# Patient Record
Sex: Male | Born: 1980
Health system: Southern US, Community
[De-identification: ages and names within clinical notes are randomized; demographics above are authoritative.]

## PROBLEM LIST (undated history)

## (undated) DIAGNOSIS — Z765 Malingerer [conscious simulation]: Secondary | ICD-10-CM

## (undated) DIAGNOSIS — F1410 Cocaine abuse, uncomplicated: Secondary | ICD-10-CM

## (undated) DIAGNOSIS — F339 Major depressive disorder, recurrent, unspecified: Secondary | ICD-10-CM

## (undated) DIAGNOSIS — G8929 Other chronic pain: Secondary | ICD-10-CM

## (undated) HISTORY — PX: OTHER SURGICAL HISTORY: SHX169

---

## 2009-09-25 ENCOUNTER — Emergency Department (HOSPITAL_BASED_OUTPATIENT_CLINIC_OR_DEPARTMENT_OTHER): Admission: EM | Admit: 2009-09-25 | Discharge: 2009-09-25 | Payer: Self-pay | Admitting: Emergency Medicine

## 2009-09-25 ENCOUNTER — Ambulatory Visit: Payer: Self-pay | Admitting: Radiology

## 2009-12-28 ENCOUNTER — Emergency Department (HOSPITAL_BASED_OUTPATIENT_CLINIC_OR_DEPARTMENT_OTHER)
Admission: EM | Admit: 2009-12-28 | Discharge: 2009-12-28 | Payer: Self-pay | Source: Home / Self Care | Admitting: Emergency Medicine

## 2010-02-16 ENCOUNTER — Emergency Department (HOSPITAL_BASED_OUTPATIENT_CLINIC_OR_DEPARTMENT_OTHER)
Admission: EM | Admit: 2010-02-16 | Discharge: 2010-02-16 | Disposition: A | Discharge status: Home / Self Care | Payer: Self-pay | Attending: Emergency Medicine | Admitting: Emergency Medicine

## 2010-02-16 DIAGNOSIS — F17200 Nicotine dependence, unspecified, uncomplicated: Secondary | ICD-10-CM | POA: Insufficient documentation

## 2010-02-16 DIAGNOSIS — K089 Disorder of teeth and supporting structures, unspecified: Secondary | ICD-10-CM | POA: Insufficient documentation

## 2010-02-16 DIAGNOSIS — B009 Herpesviral infection, unspecified: Secondary | ICD-10-CM | POA: Insufficient documentation

## 2010-03-19 LAB — URINALYSIS, ROUTINE W REFLEX MICROSCOPIC
Bilirubin Urine: NEGATIVE
Hgb urine dipstick: NEGATIVE
Ketones, ur: NEGATIVE mg/dL
Specific Gravity, Urine: 1.015 (ref 1.005–1.030)
Urobilinogen, UA: 0.2 mg/dL (ref 0.0–1.0)

## 2010-03-19 LAB — URINE MICROSCOPIC-ADD ON

## 2010-03-22 LAB — DIFFERENTIAL
Lymphs Abs: 2.2 10*3/uL (ref 0.7–4.0)
Monocytes Relative: 16 % — ABNORMAL HIGH (ref 3–12)

## 2010-03-22 LAB — LIPASE, BLOOD: Lipase: 142 U/L (ref 23–300)

## 2010-03-22 LAB — CBC
MCH: 30.2 pg (ref 26.0–34.0)
MCV: 88.2 fL (ref 78.0–100.0)
Platelets: 222 10*3/uL (ref 150–400)
RBC: 5.01 MIL/uL (ref 4.22–5.81)
RDW: 12.9 % (ref 11.5–15.5)

## 2010-03-22 LAB — COMPREHENSIVE METABOLIC PANEL
ALT: 37 U/L (ref 0–53)
Albumin: 4.3 g/dL (ref 3.5–5.2)
CO2: 30 mEq/L (ref 19–32)
Calcium: 9.7 mg/dL (ref 8.4–10.5)
Chloride: 104 mEq/L (ref 96–112)
GFR calc non Af Amer: >60 mL/min (ref >60)
Glucose, Bld: 123 mg/dL — ABNORMAL HIGH (ref 70–99)
Sodium: 142 mEq/L (ref 135–145)
Total Bilirubin: 1.2 mg/dL (ref 0.3–1.2)
Total Protein: 8 g/dL (ref 6.0–8.3)

## 2010-03-22 LAB — POCT CARDIAC MARKERS
CKMB, poc: 1.3 ng/mL (ref 1.0–8.0)
Myoglobin, poc: 86.1 ng/mL (ref 12–200)
Troponin i, poc: <0.05 ng/mL (ref 0.00–0.09)

## 2010-03-22 LAB — D-DIMER, QUANTITATIVE: D-Dimer, Quant: 0.24 ug/mL-FEU (ref 0.00–0.48)

## 2010-07-14 ENCOUNTER — Emergency Department (HOSPITAL_BASED_OUTPATIENT_CLINIC_OR_DEPARTMENT_OTHER)
Admission: EM | Admit: 2010-07-14 | Discharge: 2010-07-14 | Disposition: A | Discharge status: Home / Self Care | Payer: Self-pay | Attending: Emergency Medicine | Admitting: Emergency Medicine

## 2010-07-14 ENCOUNTER — Encounter: Payer: Self-pay | Admitting: Emergency Medicine

## 2010-07-14 DIAGNOSIS — L259 Unspecified contact dermatitis, unspecified cause: Secondary | ICD-10-CM

## 2010-07-14 DIAGNOSIS — N342 Other urethritis: Secondary | ICD-10-CM

## 2010-07-14 DIAGNOSIS — R300 Dysuria: Secondary | ICD-10-CM | POA: Insufficient documentation

## 2010-07-14 LAB — URINALYSIS, ROUTINE W REFLEX MICROSCOPIC
Bilirubin Urine: NEGATIVE
Glucose, UA: NEGATIVE mg/dL
Hgb urine dipstick: NEGATIVE
Ketones, ur: NEGATIVE mg/dL
Leukocytes, UA: NEGATIVE
Nitrite: NEGATIVE
Protein, ur: NEGATIVE mg/dL
Specific Gravity, Urine: 1.022 (ref 1.005–1.030)
Urobilinogen, UA: 0.2 mg/dL (ref 0.0–1.0)
pH: 7 (ref 5.0–8.0)

## 2010-07-14 MED ORDER — AZITHROMYCIN 500 MG PO TABS
1000.0000 mg | ORAL_TABLET | Freq: Once | ORAL | Status: AC
  Administered 2010-07-14: 1000 mg via ORAL

## 2010-07-14 MED ORDER — CEFPODOXIME PROXETIL 200 MG PO TABS
400.0000 mg | ORAL_TABLET | Freq: Once | ORAL | Status: AC
  Administered 2010-07-14: 400 mg via ORAL

## 2010-07-14 MED ORDER — HYDROCORTISONE 1 % EX CREA: TOPICAL_CREAM | Freq: Four times a day (QID) | CUTANEOUS | Status: DC

## 2010-07-14 MED ORDER — AZITHROMYCIN 250 MG PO TABS
ORAL_TABLET | ORAL | Status: AC
  Filled 2010-07-14: qty 4

## 2010-07-14 MED ORDER — CEFPODOXIME PROXETIL 200 MG PO TABS
ORAL_TABLET | ORAL | Status: AC
  Administered 2010-07-14: 400 mg via ORAL
  Filled 2010-07-14: qty 2

## 2010-07-14 MED ORDER — HYDROCORTISONE 1 % EX CREA: TOPICAL_CREAM | CUTANEOUS | Status: DC

## 2010-07-14 NOTE — ED Notes (Signed)
Rash to LE  X 1 1/2 weeks.  Pt relates itching and starting to spread.  Denies pain or environmental exposure.

## 2010-07-14 NOTE — Discharge Instructions (Signed)
 [employer] Patient Information 2011 [place], [place].Urethritis, Adult Urethritis is an inflammation (soreness) of the urethra (the tube exiting from the bladder). It is often caused by germs that may be spread through sexual contact. TREATMENT Urethritis will usually respond to antibiotics. These are medications that kill germs. Take all the medicine given to you. You may feel better in a couple days, but TAKE ALL MEDICINE or the infection may not be completely cured and may become more difficult to treat. Response can generally be expected in 7 to 10 days. You may require additional treatment after more testing. IT IS VERY IMPORTANT THAT YOU  Not have sex until the test results are known and treatment is completed.   Know that you may be asked to notify your sex partner when your final test results are back.   Finish all medications as prescribed.   Prevent sexually transmitted infections including AIDS. Practice safe sex. Use condoms.  SEEK MEDICAL CARE IF:  Your symptoms are not improved in 2 to 3 days.   Your symptoms are getting worse.   Your develop abdominal (belly) pain.   You develop joint pain.   You have an oral temperature above 102 F (38.9 C).  SEEK IMMEDIATE MEDICAL CARE IF:  You have an oral temperature above 102 F (38.9 C), not controlled by medicine.   You develop severe pain in the belly, back or side.   You develop repeated vomiting.  TEST RESULTS Not all test results are available during your visit. If your test results are not back during the visit, make an appointment with your caregiver to find out the results. Do not assume everything is normal if you have not heard from your caregiver or the medical facility. It is important for you to follow-up on all of your test results. Document Released: 06/19/2000 Document Re-Released: 01/15/2009 [employer] Patient Information 2011 [employer], [place] Allergic Contact Dermatitis Allergic contact dermatitis is an itchy,  red and scaly rash. It is caused by an allergic reaction to certain substances that touch your skin. The rash often times appears within one to three days after contact.   CAUSES  Poison oak.   Poison ivy.   Chemicals (deodorants, shampoos).   Jewelry.   Latex.   Neomycin in triple antibiotic cream.  SYMPTOMS  In poison ivy or oak, a very itchy rash with tiny blisters may appear. This comes 1-2 days after exposure.   The skin may also appear to be dry with flaking.   There can be marked swelling in some areas such as the eyelids, mouth or genitals.  TREATMENT It is most important to avoid all contact with the allergic substances. Additional treatment of allergic dermatitis may include:  Burrow's solution soaks to help dry the oozing sores.   Cortisone creams or ointments applied 3-4 times daily. For best results, soak rash area in cool water for 20 minutes. Then apply the medicine. Cover the area with a plastic wrap. You can store the cortisone cream in the refrigerator for a chilly effect on your rash. That may decrease itching.   Injections or oral cortisone medicines are needed in more severe cases.   Antihistamine medicine helps ease the itching.   A cool bath can also help stop itching. Try not to scratch the rash.   Medicines that kill germs (antibiotics) may be needed if there is a skin infection present.  DIAGNOSIS In cases where the cause is uncertain, patch skin tests may be performed to help confirm the identity  of the source. HOME CARE INSTRUCTIONS  Use medications as directed.   Keep the affected area away from anything that would irritate your skin.   If you have poison oak or ivy, the clothing you were wearing at the time of contact should be washed or cleaned. This will get rid of the substance (resin) that caused the allergic reaction.   If you have a pet, the resins may also be present in their fur. They also need washing with soap and water.   You  may also prevent poison oak or ivy by washing exposed skin with soap and water as soon as possible after contact.   As with poison ivy, avoid contact with these plants. Wear protective clothing and gloves when you must be in contact. A barrier cream such as Ivy Block or Shield before exposure will help an outbreak if you bathe within 8 hours of contact.  SEEK MEDICAL CARE IF:  Your condition is not better after 3 days of treatment or you seem to keep getting worse.   You see signs of infection, such as swelling, tenderness, redness or soreness (inflammation), or warmth of affected area.   You have any problems related to medication used.  Document Released: 02/01/2004 Document Re-Released: 03/22/2008 [facility] Patient Information 2011 [place], [place].

## 2010-07-14 NOTE — ED Provider Notes (Addendum)
History     Chief Complaint  Patient presents with  . Rash   HPI Comments: Pt reported staying at a motel about 2-3 days ago and noticed rash and itching afterwards.  He also did have sexual intercourse with a new partner a few weeks ago, used condom but it came off during.  Patient is a 30 y.o. male presenting with rash and dysuria. The history is provided by the patient.  Rash  This is a new problem. The current episode started 2 days ago. The problem has been gradually worsening. The problem is associated with an unknown factor. There has been no fever. The rash is present on the right lower leg (and right shoulder). The patient is experiencing no pain. Associated symptoms include itching. He has tried antibiotic cream and antihistamines for the symptoms. The treatment provided no relief.  Dysuria  This is a new problem. The current episode started more than 2 days ago. The problem occurs every urination. The problem has not changed since onset.The quality of the pain is described as burning. The pain is mild. There has been no fever. He is sexually active. There is no history of pyelonephritis. Associated symptoms include frequency and hesitancy. Pertinent negatives include no chills, no nausea, no vomiting, no discharge and no flank pain. He has tried nothing for the symptoms. His past medical history does not include kidney stones or recurrent UTIs.    History reviewed. No pertinent past medical history.  History reviewed. No pertinent past surgical history.  History reviewed. No pertinent family history.  History  Substance Use Topics  . Smoking status: Current Everyday Smoker  . Smokeless tobacco: Not on file  . Alcohol Use: No      Review of Systems  Constitutional: Negative.  Negative for chills.  Respiratory: Negative for shortness of breath.   Cardiovascular: Negative for leg swelling.  Gastrointestinal: Negative for nausea and vomiting.  Genitourinary: Positive for  dysuria, hesitancy and frequency. Negative for flank pain, discharge, penile swelling, scrotal swelling and testicular pain.  Musculoskeletal: Negative for back pain.  Skin: Positive for itching and rash.    Physical Exam  BP 120/75  Pulse 92  Temp(Src) 98.2 F (36.8 C) (Oral)  Resp 20  SpO2 98%  Physical Exam  Constitutional: He appears well-developed and well-nourished. No distress.  Eyes: Conjunctivae are normal. Pupils are equal, round, and reactive to light.  Pulmonary/Chest: Effort normal and breath sounds normal.  Genitourinary: Testes normal and penis normal. Circumcised. No penile erythema or penile tenderness. No discharge found.  Skin:       ED Course  Procedures  MDM Pt with some dysuria, no flank pain, no fever or vomiting.  Will check GC/Chl and also UA.  Also rash appears to be from some kind of bite, no severe allergic reaction noted.  Local steroid cream for this problem and reassurance provided.      UA is neg for UTI.  Pt has had chlamydia in the past, will presumptively treat and discahrge to home.  Cultures can be called in to patient later  Gavin Pound. Oletta Lamas, MD 07/14/10 1610  Gavin Pound. Maleaha Hughett, MD 08/02/10 [phone]

## 2010-07-16 LAB — URINE CULTURE
Colony Count: NO GROWTH
Culture  Setup Time: 201207080250
Culture: NO GROWTH

## 2010-07-16 LAB — GC/CHLAMYDIA PROBE AMP, GENITAL
Chlamydia, DNA Probe: NEGATIVE
GC Probe Amp, Genital: NEGATIVE

## 2010-10-07 ENCOUNTER — Encounter (HOSPITAL_BASED_OUTPATIENT_CLINIC_OR_DEPARTMENT_OTHER): Payer: Self-pay | Admitting: Emergency Medicine

## 2010-10-07 ENCOUNTER — Emergency Department (HOSPITAL_BASED_OUTPATIENT_CLINIC_OR_DEPARTMENT_OTHER)
Admission: EM | Admit: 2010-10-07 | Discharge: 2010-10-07 | Disposition: A | Discharge status: Home / Self Care | Payer: Self-pay | Attending: Emergency Medicine | Admitting: Emergency Medicine

## 2010-10-07 DIAGNOSIS — K1370 Unspecified lesions of oral mucosa: Secondary | ICD-10-CM | POA: Insufficient documentation

## 2010-10-07 DIAGNOSIS — B002 Herpesviral gingivostomatitis and pharyngotonsillitis: Secondary | ICD-10-CM | POA: Insufficient documentation

## 2010-10-07 DIAGNOSIS — F17200 Nicotine dependence, unspecified, uncomplicated: Secondary | ICD-10-CM | POA: Insufficient documentation

## 2010-10-07 LAB — URINALYSIS, ROUTINE W REFLEX MICROSCOPIC
Bilirubin Urine: NEGATIVE
Glucose, UA: NEGATIVE mg/dL
Leukocytes, UA: NEGATIVE

## 2010-10-07 MED ORDER — VALACYCLOVIR HCL 500 MG PO TABS: 500.0000 mg | ORAL_TABLET | Freq: Two times a day (BID) | ORAL | Status: AC

## 2010-10-07 NOTE — ED Provider Notes (Signed)
History     CSN: [id number] Arrival date & time: 10/07/2010 11:39 AM Pt seen at 1150am  Chief Complaint  Patient presents with  . Mouth Lesions  . Urinary Urgency    (Consider location/radiation/quality/duration/timing/severity/associated sxs/prior treatment) Patient is a 30 y.o. male presenting with mouth sores. The history is provided by the patient.  Mouth Lesions  The current episode started more than 2 weeks ago. The problem occurs frequently. The problem has been gradually worsening. The problem is mild. The symptoms are relieved by nothing. The symptoms are aggravated by nothing. Associated symptoms include mouth sores. Pertinent negatives include no fever.    Patient is here for multiple complaints He reports fever blisters to his lips for several months with worsening, no fever/chills He reports using Valtrex previously with good relief  He also reports urinary frequency but no penile discharge no dysuria no abdominal pain, no penile lesion He does reports mild back pain is improving Reports sexually active with one partner  History reviewed. No pertinent past medical history.  History reviewed. No pertinent past surgical history.  No family history on file.  History  Substance Use Topics  . Smoking status: Current Everyday Smoker  . Smokeless tobacco: Not on file  . Alcohol Use: No      Review of Systems  Constitutional: Negative for fever.  HENT: Positive for mouth sores.     Allergies  Review of patient's allergies indicates no known allergies.  Home Medications   Current Outpatient Rx  Name Route Sig Dispense Refill  . DIPHENHYDRAMINE HCL 25 MG PO TABS Oral Take 25 mg by mouth every 6 (six) hours as needed.      Marland Kitchen HYDROCORTISONE 1 % EX CREA  Apply to affected area 3-4 times daily 15 g 0  . VALACYCLOVIR HCL 500 MG PO TABS Oral Take 1 tablet (500 mg total) by mouth 2 (two) times daily. 6 tablet 0    BP 121/72  Pulse 64  Temp(Src) 97.3 F (36.3  C) (Oral)  Resp 16  SpO2 100%  Physical Exam   CONSTITUTIONAL: Well developed/well nourished HEAD AND FACE: Normocephalic/atraumatic EYES: EOMI/PERRL ENMT: Mucous membranes moist, small blisters noted to lips, no abscess or significant erythema noted NECK: supple no meningeal signs CV: S1/S2 noted, no murmurs/rubs/gallops noted LUNGS: Lungs are clear to auscultation bilaterally, no apparent distress ABDOMEN: soft, nontender, no rebound or guarding GU:no cva tenderness, no penile lesions, no penile discharge, no testicular tenderness/mass noted Chaperone present during exam NEURO: Pt is awake/alert, moves all extremitiesx4 EXTREMITIES: pulses normal, full ROM SKIN: warm, color normal   ED Course  Procedures (including critical care time)   Labs Reviewed  URINALYSIS, ROUTINE W REFLEX MICROSCOPIC  GC/CHLAMYDIA PROBE AMP, GENITAL   No results found.   1. Recurrent oral herpes simplex       MDM  All labs/vitals reviewed and considered Nursing notes reviewed and considered in documentation Previous records reviewed and considered  Pt requesting valtrex, I advised OTC meds I took a GC/chlam sample, advised pt to f/u on results        Joya Gaskins, MD 10/07/10 1238

## 2010-10-07 NOTE — ED Notes (Signed)
Pt c/o fever blisters on lips x 2 mos; also c/o "peeing just a little" & low back pain

## 2010-10-08 LAB — GC/CHLAMYDIA PROBE AMP, GENITAL
Chlamydia, DNA Probe: NEGATIVE
GC Probe Amp, Genital: NEGATIVE

## 2011-02-10 ENCOUNTER — Encounter (HOSPITAL_BASED_OUTPATIENT_CLINIC_OR_DEPARTMENT_OTHER): Payer: Self-pay | Admitting: *Deleted

## 2011-02-10 ENCOUNTER — Emergency Department (HOSPITAL_BASED_OUTPATIENT_CLINIC_OR_DEPARTMENT_OTHER)
Admission: EM | Admit: 2011-02-10 | Discharge: 2011-02-10 | Disposition: A | Discharge status: Home / Self Care | Payer: Self-pay | Attending: Emergency Medicine | Admitting: Emergency Medicine

## 2011-02-10 DIAGNOSIS — J029 Acute pharyngitis, unspecified: Secondary | ICD-10-CM | POA: Insufficient documentation

## 2011-02-10 DIAGNOSIS — F17200 Nicotine dependence, unspecified, uncomplicated: Secondary | ICD-10-CM | POA: Insufficient documentation

## 2011-02-10 LAB — MONONUCLEOSIS SCREEN: Mono Screen: NEGATIVE

## 2011-02-10 MED ORDER — DEXAMETHASONE SODIUM PHOSPHATE 10 MG/ML IJ SOLN
10.0000 mg | Freq: Once | INTRAMUSCULAR | Status: AC
Start: 2011-02-10 — End: 2011-02-10
  Administered 2011-02-10: 10 mg via INTRAMUSCULAR
  Filled 2011-02-10: qty 1

## 2011-02-10 NOTE — ED Provider Notes (Signed)
History     CSN: [id number]  Arrival date & time 02/10/11  1458   First MD Initiated Contact with Patient 02/10/11 1519      Chief Complaint  Patient presents with  . Sore Throat    (Consider location/radiation/quality/duration/timing/severity/associated sxs/prior treatment) HPI Comments: Pt states that he has had intermittent fever over the last month  Patient is a 31 y.o. male presenting with pharyngitis. The history is provided by the patient. No language interpreter was used.  Sore Throat This is a new problem. The current episode started 1 to 4 weeks ago. The problem occurs constantly. The problem has been unchanged. Associated symptoms include a fever and a sore throat. Pertinent negatives include no abdominal pain, neck pain or rash. The symptoms are aggravated by swallowing. He has tried nothing for the symptoms.    History reviewed. No pertinent past medical history.  History reviewed. No pertinent past surgical history.  History reviewed. No pertinent family history.  History  Substance Use Topics  . Smoking status: Current Everyday Smoker  . Smokeless tobacco: Not on file  . Alcohol Use: No      Review of Systems  Constitutional: Positive for fever.  HENT: Positive for sore throat. Negative for neck pain.   Gastrointestinal: Negative for abdominal pain.  Skin: Negative for rash.  All other systems reviewed and are negative.    Allergies  Review of patient's allergies indicates no known allergies.  Home Medications   Current Outpatient Rx  Name Route Sig Dispense Refill  . DIPHENHYDRAMINE HCL 25 MG PO TABS Oral Take 25 mg by mouth every 6 (six) hours as needed.      Marland Kitchen HYDROCORTISONE 1 % EX CREA  Apply to affected area 3-4 times daily 15 g 0    BP 129/74  Pulse 90  Temp(Src) 98 F (36.7 C) (Oral)  Resp 18  Ht 5\' 11"  (1.803 m)  Wt 160 lb (72.576 kg)  BMI 22.32 kg/m2  SpO2 100%  Physical Exam  Nursing note and vitals reviewed. Constitutional:  He is oriented to person, place, and time. He appears well-developed and well-nourished.  HENT:  Right Ear: External ear normal.  Left Ear: External ear normal.  Mouth/Throat: Posterior oropharyngeal edema and posterior oropharyngeal erythema present. No oropharyngeal exudate or tonsillar abscesses.  Eyes: EOM are normal.  Neck: Neck supple.  Cardiovascular: Normal rate and regular rhythm.   Pulmonary/Chest: Effort normal and breath sounds normal.  Neurological: He is alert and oriented to person, place, and time.  Skin: Skin is warm and dry.  Psychiatric: He has a normal mood and affect.    ED Course  Procedures (including critical care time)   Labs Reviewed  STREP A DNA PROBE  MONONUCLEOSIS SCREEN   No results found.   1. Pharyngitis       MDM  Pt given steroids her to help with inflammation        Teressa Lower, NP 02/10/11 [phone]

## 2011-02-10 NOTE — ED Provider Notes (Signed)
Medical screening examination/treatment/procedure(s) were performed by non-physician practitioner and as supervising physician I was immediately available for consultation/collaboration.  Evvie Behrmann, MD 02/10/11 2203 

## 2011-02-10 NOTE — ED Notes (Signed)
Pt states he has had a sore throat and trouble swallowing x 1 month. Feels like he has to clear his throat all the time. Throat is red and swollen.

## 2011-04-15 ENCOUNTER — Encounter (HOSPITAL_BASED_OUTPATIENT_CLINIC_OR_DEPARTMENT_OTHER): Payer: Self-pay | Admitting: *Deleted

## 2011-04-15 ENCOUNTER — Emergency Department (HOSPITAL_BASED_OUTPATIENT_CLINIC_OR_DEPARTMENT_OTHER)
Admission: EM | Admit: 2011-04-15 | Discharge: 2011-04-15 | Disposition: A | Discharge status: Home / Self Care | Payer: Self-pay | Attending: Emergency Medicine | Admitting: Emergency Medicine

## 2011-04-15 DIAGNOSIS — R221 Localized swelling, mass and lump, neck: Secondary | ICD-10-CM | POA: Insufficient documentation

## 2011-04-15 DIAGNOSIS — R220 Localized swelling, mass and lump, head: Secondary | ICD-10-CM | POA: Insufficient documentation

## 2011-04-15 DIAGNOSIS — B0089 Other herpesviral infection: Secondary | ICD-10-CM

## 2011-04-15 DIAGNOSIS — F17200 Nicotine dependence, unspecified, uncomplicated: Secondary | ICD-10-CM | POA: Insufficient documentation

## 2011-04-15 MED ORDER — HYDROCODONE-ACETAMINOPHEN 5-325 MG PO TABS: 1.0000 | ORAL_TABLET | Freq: Four times a day (QID) | ORAL | Status: AC | PRN

## 2011-04-15 MED ORDER — ACYCLOVIR 400 MG PO TABS: 400.0000 mg | ORAL_TABLET | Freq: Every day | ORAL | Status: AC

## 2011-04-15 NOTE — Discharge Instructions (Signed)
Herpes Simplex Virus Herpes simplex virus is a viral infection that may infect many different areas of the body, such as the genitalia and mouth. There are two different strains of the virus: herpes simplex virus 1 (HSV-1) and herpes simplex virus 2 (HSV-2). HSV-1 is typically associated with infections of the mouth and lips. HSV-2 is associated with infections of the genitals. However, either strain of the virus may infect any area. HSV may be spread through saliva particles or sexual contact. One unusual form of HSV-1, known as herpes gladiatorum, is passed from skin-to-skin contact, such as in wrestling. SYMPTOMS   Sometimes, no symptoms.   Fever.   Headache.   Muscle aches.   Tingling.   Itching.   Tenderness.   Genital burning feeling.   Genital pain.   Pain with urination.   Pain with sexual intercourse.   Small blisters in the affected areas.  RISK FACTORS   Kissing an infected person.   Sharing eating utensils with an infected person.   Unprotected sexual activity.   Multiple sexual partners.   Direct contact sports without protective clothing.   Contact with an exposed herpes sore.   Stress, illness, and cold increase the risk of recurrence.  PROGNOSIS  The primary outbreak of an HSV infection usually lasts 2 to 3 weeks. However, it has been known to last up to 6 weeks. After the primary outbreak subsides, the virus goes into a stage known as latency. During this time, there may be no physical symptoms of infection. After a period of time, some event, such as stress, cold, or illness will trigger another outbreak. This cycle of latency and outbreak may continue indefinitely. The outbreaks usually become milder over time. The body cannot rid itself of HSV. RELATED COMPLICATIONS   Recurrence.   Infection in other areas of the body, such as the eye (ocular herpetic infection, keratitis) and rarely the brain (herpetic encephalitis).  TREATMENT  Many HSV  infections can be treated without medicine. During an outbreak, avoid touching the sores. Ice may be used to dull the pain and suppress the virus. Exposure to the sun is a common trigger for an outbreak, so the use of sunscreen may help in such cases. Avoid sexual contact during outbreaks. During the latent periods, it is advised that you use latex condoms, which will reduce the likelihood of spreading the virus to another person. Condoms made from animal products do not protect against HSV. Male condoms cover a larger area than male condoms, and may offer the most protection from the transmission of HSV. The presence of HSV will not affect a condom's ability to protect against pregnancy. Only take medicines for pain and discomfort if directed to do so by your caregiver. Many claims exist that certain dietary changes will prevent an outbreak, but these claims have not been proven. These claims include eating foods that are high in L-lysine and low in arginine (i.e. yogurt, beets, apples, pears, mangoes, oily fish (such as salmon, haddock, snapper, and swordfish), soybean sprouts, chicken, and tomatoes).  Athletes may return to play once they are showing no symptoms, and they have been treated.  Document Released: 12/24/2004 Document Revised: 12/13/2010 Document Reviewed: 04/07/2008 [facility] Patient Information 2012 [place], [place].

## 2011-04-15 NOTE — ED Provider Notes (Signed)
History     CSN: [id number]  Arrival date & time 04/15/11  1731   First MD Initiated Contact with Patient 04/15/11 1813    5:57 PM HPI Omar Miller is a 31 y.o. male complaining of upper lip infection. Reports about 2 weeks ago began having a "fever blisters" on his upper lip. States since then has had progressive worsening of symptoms. Reports lip is red and painful. Denies fever, n/v, neck pain. Reports pain radiates to all his teeth. And he is having a difficult time speaking.  The history is provided by the patient.    History reviewed. No pertinent past medical history.  History reviewed. No pertinent past surgical history.  No family history on file.  History  Substance Use Topics  . Smoking status: Current Everyday Smoker  . Smokeless tobacco: Not on file  . Alcohol Use: No      Review of Systems  Constitutional: Negative for fever and chills.  HENT: Positive for facial swelling (lip swelling). Negative for nosebleeds, congestion and neck pain.   Gastrointestinal: Negative for nausea and vomiting.  Skin: Positive for color change, rash and wound.  Neurological: Negative for dizziness and headaches.  All other systems reviewed and are negative.    Allergies  Review of patient's allergies indicates no known allergies.  Home Medications  No current outpatient prescriptions on file.  BP 119/80  Pulse 100  Temp(Src) 98.8 F (37.1 C) (Oral)  Resp 20  Ht 5\' 10"  (1.778 m)  Wt 160 lb (72.576 kg)  BMI 22.96 kg/m2  SpO2 100%  Physical Exam  Constitutional: He is oriented to person, place, and time. He appears well-developed and well-nourished.  HENT:  Head: Normocephalic and atraumatic.  Mouth/Throat:    Eyes: Conjunctivae are normal. Pupils are equal, round, and reactive to light.  Neck: Normal range of motion. Neck supple.  Cardiovascular: Normal rate, regular rhythm and normal heart sounds.   Pulmonary/Chest: Effort normal and breath sounds normal.    Neurological: He is alert and oriented to person, place, and time.  Skin: Skin is warm and dry. No rash noted. No erythema. No pallor.  Psychiatric: He has a normal mood and affect. His behavior is normal.    ED Course  Procedures   MDM   Will treat with acyclovir and advised recheck in the ED in 48-72 hours. Discussed plan with Dr. Fredricka Bonine. Patient will also be given an analgesic.         Thomasene Lot, PA-C 04/15/11 1905

## 2011-04-15 NOTE — ED Notes (Signed)
Patient states he has had multiple fever blisters on his upper lip for the last 4-5 days.  Hx of same, but not as severe as today.

## 2011-05-04 NOTE — ED Provider Notes (Signed)
Evaluation and management procedures were performed by the PA/NP/resident physician under my supervision/collaboration.   Felisa Bonier, MD 05/04/11 [phone]

## 2011-10-19 IMAGING — CR DG FINGER LITTLE 2+V*L*
3 series · 3 of 3 positions shown · non-contrast
Comparison: None

CLINICAL DATA: Injury to left fifth finger

LEFT LITTLE FINGER 2+V

[x finger pa left]
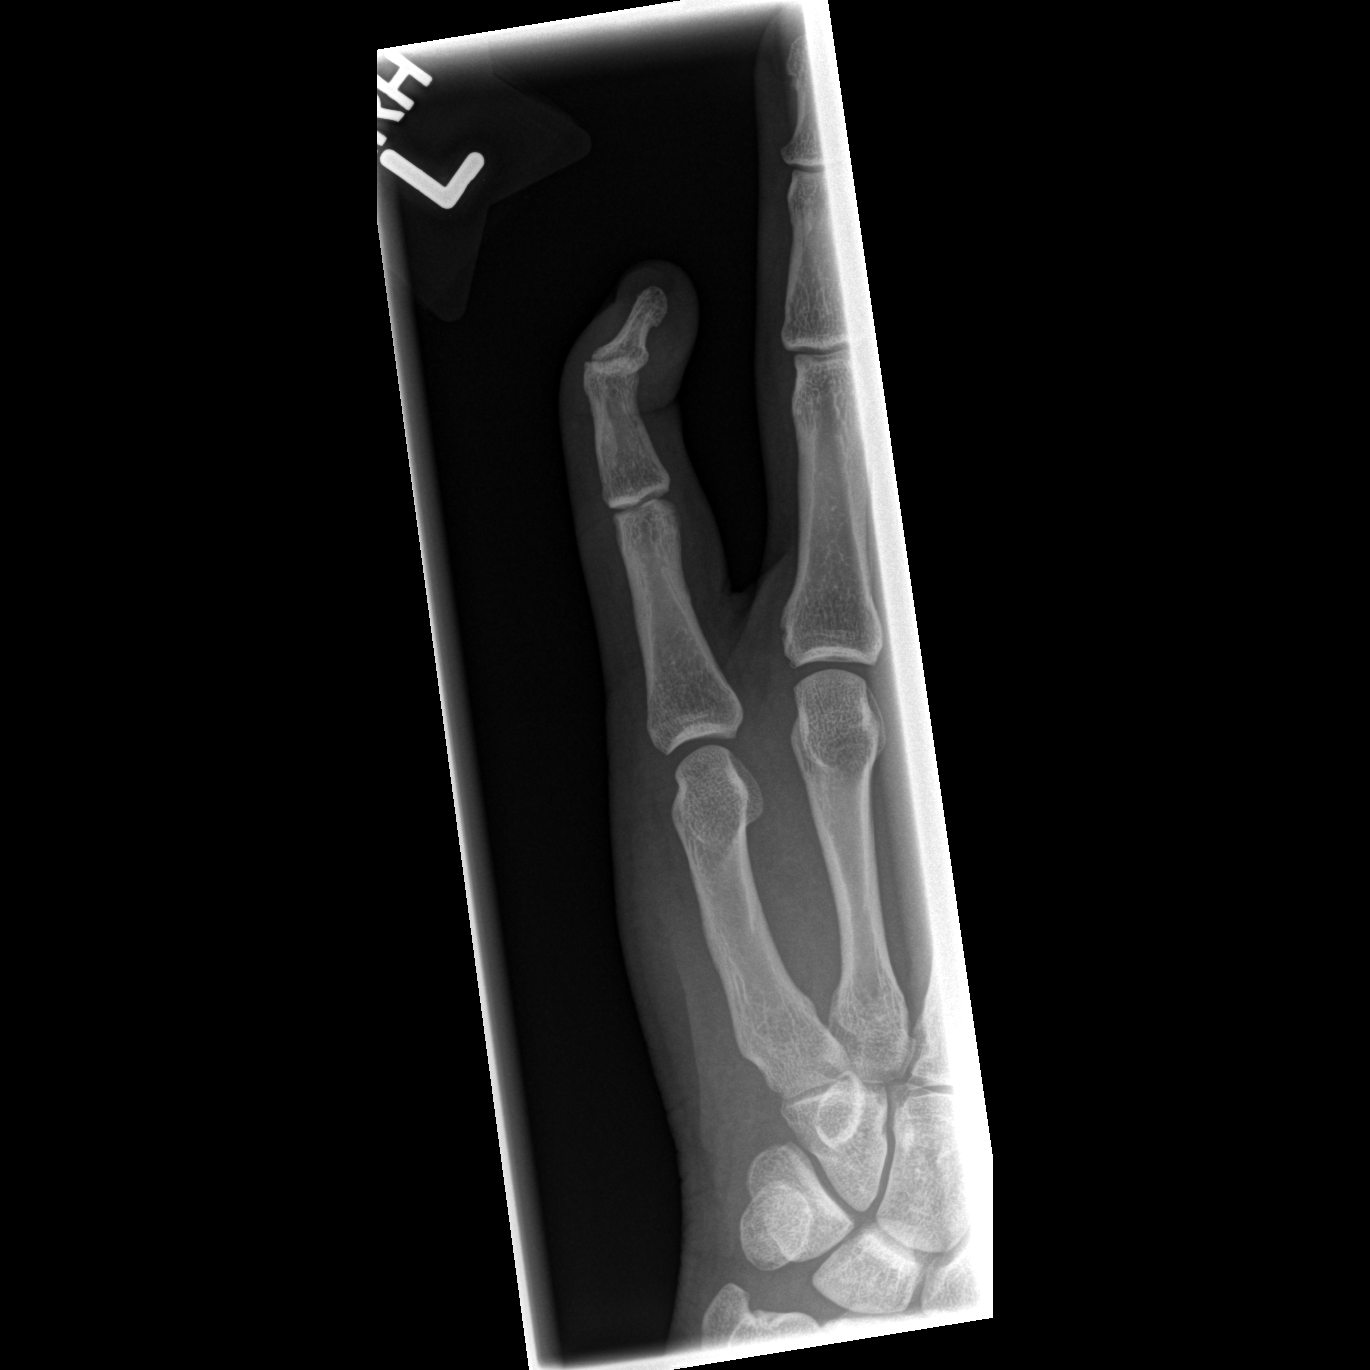

[x finger obl. left]
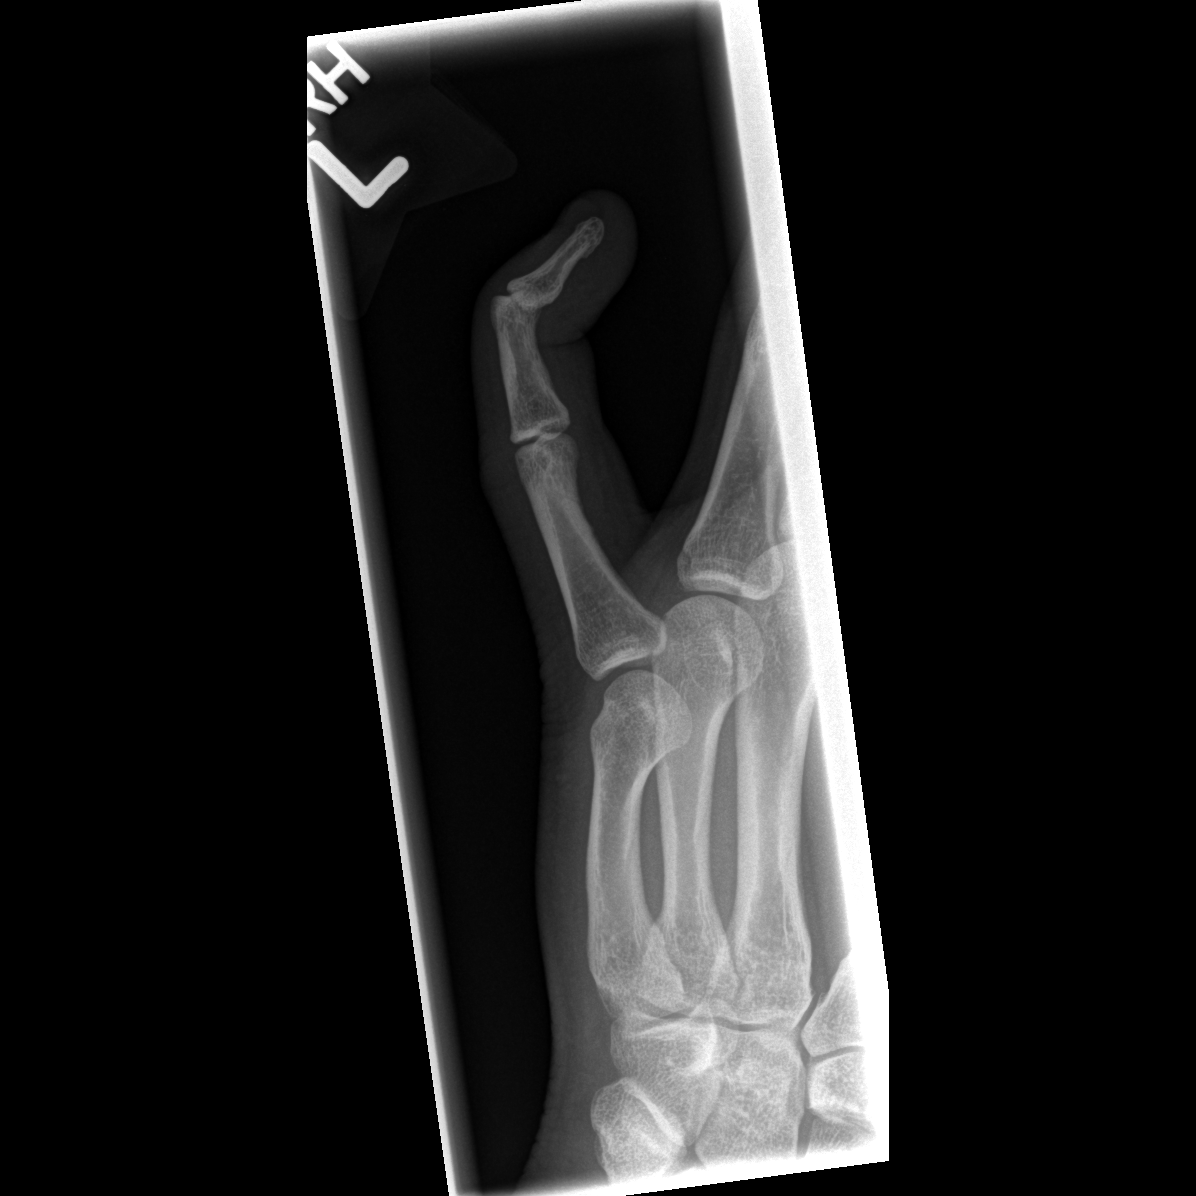

[x finger lateral left]
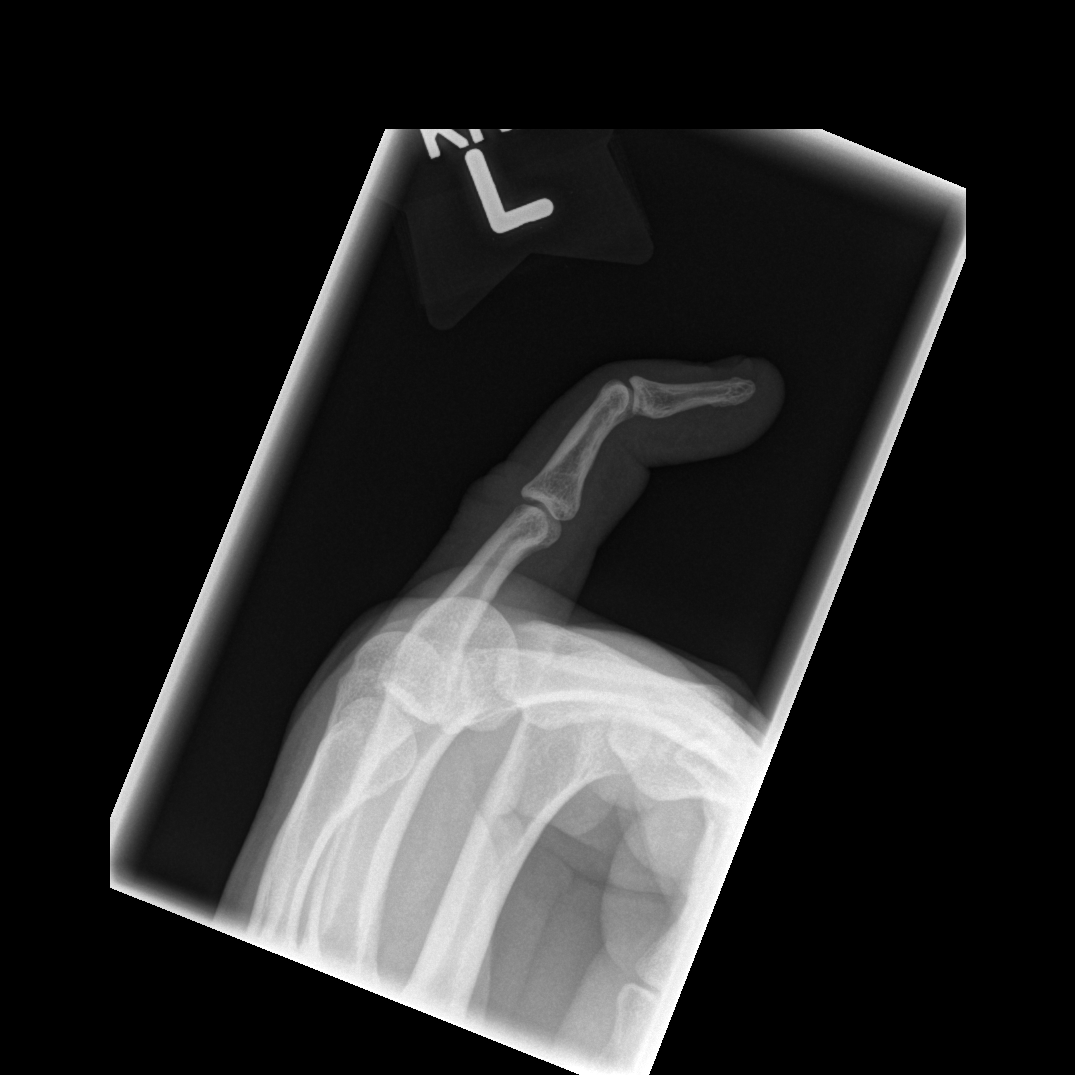

[3 of 3 positions shown; findings below may reference images not displayed]

FINDINGS: The DIP joint is in a flexed position.  No definite
fracture or dislocation.  No foreign body or gas in the soft
tissues.
IMPRESSION: Normal except that the DIP joint is in a flexed position.

## 2016-01-08 HISTORY — PX: EXTENSOR TENDON OF FOREARM / WRIST REPAIR: SHX1547

## 2016-05-27 ENCOUNTER — Encounter (HOSPITAL_BASED_OUTPATIENT_CLINIC_OR_DEPARTMENT_OTHER): Payer: Self-pay | Admitting: Emergency Medicine

## 2016-05-27 ENCOUNTER — Emergency Department (HOSPITAL_BASED_OUTPATIENT_CLINIC_OR_DEPARTMENT_OTHER)
Admission: EM | Admit: 2016-05-27 | Discharge: 2016-05-27 | Disposition: A | Discharge status: Home / Self Care | Payer: Self-pay | Attending: Physician Assistant | Admitting: Physician Assistant

## 2016-05-27 DIAGNOSIS — Z87891 Personal history of nicotine dependence: Secondary | ICD-10-CM | POA: Insufficient documentation

## 2016-05-27 DIAGNOSIS — N342 Other urethritis: Secondary | ICD-10-CM | POA: Insufficient documentation

## 2016-05-27 LAB — URINALYSIS, ROUTINE W REFLEX MICROSCOPIC
BILIRUBIN URINE: NEGATIVE
GLUCOSE, UA: NEGATIVE mg/dL
HGB URINE DIPSTICK: NEGATIVE
KETONES UR: NEGATIVE mg/dL
Leukocytes, UA: NEGATIVE
Nitrite: NEGATIVE
PROTEIN: NEGATIVE mg/dL
Specific Gravity, Urine: 1.017 (ref 1.005–1.030)
pH: 7 (ref 5.0–8.0)

## 2016-05-27 MED ORDER — METRONIDAZOLE 500 MG PO TABS
2000.0000 mg | ORAL_TABLET | Freq: Once | ORAL | Status: AC
  Administered 2016-05-27: 2000 mg via ORAL
  Filled 2016-05-27: qty 4

## 2016-05-27 MED ORDER — CEFTRIAXONE SODIUM 250 MG IJ SOLR
250.0000 mg | Freq: Once | INTRAMUSCULAR | Status: AC
  Administered 2016-05-27: 250 mg via INTRAMUSCULAR
  Filled 2016-05-27: qty 250

## 2016-05-27 MED ORDER — AZITHROMYCIN 250 MG PO TABS
1000.0000 mg | ORAL_TABLET | Freq: Once | ORAL | Status: AC
  Administered 2016-05-27: 1000 mg via ORAL
  Filled 2016-05-27: qty 4

## 2016-05-27 NOTE — Discharge Instructions (Signed)
Return here as needed.  Follow-up with a primary doctor as needed. 

## 2016-05-27 NOTE — ED Triage Notes (Signed)
Patient states that he started to have burning with urination yesterday and has a discharge.

## 2016-05-27 NOTE — ED Notes (Signed)
[facility] called for transportation

## 2016-05-27 NOTE — ED Provider Notes (Signed)
[facility] Provider Note   CSN: [id number] Arrival date & time: 05/27/16  1431  By signing my name below, I, Rosana Fret, attest that this documentation has been prepared under the direction and in the presence of non-physician practitioner, Kesleigh Morson, Cristal Deer, PA-C.  Electronically Signed: Rosana Fret, ED Scribe. 05/27/16. 5:32 PM.   History   Chief Complaint Chief Complaint  Patient presents with  . Dysuria   The history is provided by the patient. No language interpreter was used.   HPI Comments: Omar Miller is a 36 y.o. male who presents to the Emergency Department complaining of moderate dysuria onset several days ago. Pt describes pain as a burning sensation when he urinates. Pt reports associated clear/white discharge from his penis. Per pt, he has had unprotected sexual contact in the last week. No hx of similar symptoms. No treatment tried prior to arrival in the ED. Pt denies fever or any other complaints at this time.  History reviewed. No pertinent past medical history.  There are no active problems to display for this patient.   History reviewed. No pertinent surgical history.     Home Medications    Prior to Admission medications   Not on File    Family History History reviewed. No pertinent family history.  Social History Social History  Substance Use Topics  . Smoking status: Former Smoker    Quit date: 11/23/2015  . Smokeless tobacco: Never Used  . Alcohol use No     Allergies   Patient has no known allergies.   Review of Systems Review of Systems All other systems negative except as documented in the HPI. All pertinent positives and negatives as reviewed in the HPI.  Physical Exam Updated Vital Signs BP 129/81 (BP Location: Right Arm)   Pulse 86   Temp 98.3 F (36.8 C) (Oral)   Resp 20   Ht 5\' 11"  (1.803 m)   Wt 183 lb (83 kg)   SpO2 100%   BMI 25.52 kg/m   Physical Exam  Constitutional: He is oriented  to person, place, and time. He appears well-developed and well-nourished. No distress.  HENT:  Head: Normocephalic and atraumatic.  Eyes: Pupils are equal, round, and reactive to light.  Cardiovascular: Normal rate.   Pulmonary/Chest: Effort normal.  Genitourinary: Penis normal.  Genitourinary Comments: No rash to the scrotum. No discharge noted.  Neurological: He is alert and oriented to person, place, and time.  Skin: Skin is warm and dry.  Psychiatric: He has a normal mood and affect.  Nursing note and vitals reviewed.    ED Treatments / Results  DIAGNOSTIC STUDIES: Oxygen Saturation is 100% on RA, normal by my interpretation.   COORDINATION OF CARE: 4:25 PM-Discussed next steps with pt including STD testing. Pt verbalized understanding and is agreeable with the plan.   Labs (all labs ordered are listed, but only abnormal results are displayed) Labs Reviewed  URINALYSIS, ROUTINE W REFLEX MICROSCOPIC    EKG  EKG Interpretation None       Radiology No results found.  Procedures Procedures (including critical care time)  Medications Ordered in ED Medications - No data to display   Initial Impression / Assessment and Plan / ED Course  I have reviewed the triage vital signs and the nursing notes.  Pertinent labs & imaging results that were available during my care of the patient were reviewed by me and considered in my medical decision making (see chart for details).     Patient be  treated for STDs, based on the fact that he has discharge and dysuria.  The patient is advised to return here as needed.  Patient agrees the plan and all questions were answered  Final Clinical Impressions(s) / ED Diagnoses   Final diagnoses:  None    New Prescriptions New Prescriptions   No medications on file   I personally performed the services described in this documentation, which was scribed in my presence. The recorded information has been reviewed and is  accurate.     Charlestine NightLawyer, Ernest Orr, PA-C 05/28/16 0115    Abelino DerrickMackuen, Courteney Lyn, MD 05/28/16 2350

## 2016-05-28 LAB — GC/CHLAMYDIA PROBE AMP (~~LOC~~) NOT AT ARMC
Chlamydia: NEGATIVE
Neisseria Gonorrhea: NEGATIVE

## 2016-06-13 ENCOUNTER — Emergency Department (HOSPITAL_BASED_OUTPATIENT_CLINIC_OR_DEPARTMENT_OTHER)
Admission: EM | Admit: 2016-06-13 | Discharge: 2016-06-13 | Disposition: A | Discharge status: Home / Self Care | Payer: Self-pay | Attending: Emergency Medicine | Admitting: Emergency Medicine

## 2016-06-13 ENCOUNTER — Encounter (HOSPITAL_BASED_OUTPATIENT_CLINIC_OR_DEPARTMENT_OTHER): Payer: Self-pay | Admitting: Emergency Medicine

## 2016-06-13 DIAGNOSIS — N342 Other urethritis: Secondary | ICD-10-CM | POA: Insufficient documentation

## 2016-06-13 DIAGNOSIS — Z87891 Personal history of nicotine dependence: Secondary | ICD-10-CM | POA: Insufficient documentation

## 2016-06-13 LAB — URINALYSIS, ROUTINE W REFLEX MICROSCOPIC
Bilirubin Urine: NEGATIVE
Glucose, UA: NEGATIVE mg/dL
Hgb urine dipstick: NEGATIVE
Ketones, ur: NEGATIVE mg/dL
LEUKOCYTES UA: NEGATIVE
NITRITE: NEGATIVE
PH: 7 (ref 5.0–8.0)
Protein, ur: NEGATIVE mg/dL
SPECIFIC GRAVITY, URINE: 1.018 (ref 1.005–1.030)

## 2016-06-13 MED ORDER — METRONIDAZOLE 500 MG PO TABS
2000.0000 mg | ORAL_TABLET | Freq: Once | ORAL | Status: AC
  Administered 2016-06-13: 2000 mg via ORAL
  Filled 2016-06-13: qty 4

## 2016-06-13 MED ORDER — DOXYCYCLINE HYCLATE 100 MG PO CAPS: 100.0000 mg | ORAL_CAPSULE | Freq: Two times a day (BID) | ORAL | 0 refills | Status: DC

## 2016-06-13 NOTE — ED Notes (Signed)
Pt called for room no response.  

## 2016-06-13 NOTE — Discharge Instructions (Signed)
Get help right away if: You have severe pain in the belly, back, or side. You have repeated vomiting.

## 2016-06-13 NOTE — ED Notes (Signed)
[facility] with [facility] to let her know pt is ready for discharge -- states someone will come to pick him up.

## 2016-06-13 NOTE — ED Triage Notes (Addendum)
[facility] is [facility]. Ongoing penile discharge and dysuria from his last visit. Pt received abx while in the department.

## 2016-06-13 NOTE — ED Provider Notes (Signed)
[facility]   CSN: [id number] Arrival date & time: 06/13/16  1318     History   Chief Complaint Chief Complaint  Patient presents with  . Penile Discharge    HPI Omar Miller is a 36 y.o. male who presents emergency Department with chief complaint of penile discharge. The patient was seen 2 weeks ago for the same and treated with IM Rocephin and azithromycin. He states that he is having a burning sensation with urination and has noticed white and clear discharge from his penis. He also states that when he is defecating. He has discharge from his penis. He denies any other pain with defecation, pain in his rectum. He is heterosexual and had an unprotected sexual intercourse about a month ago. 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  HPI  History reviewed. No pertinent past medical history.  There are no active problems to display for this patient.   History reviewed. No pertinent surgical history.     Home Medications    Prior to Admission medications   Not on File    Family History No family history on file.  Social History Social History  Substance Use Topics  . Smoking status: Former Smoker    Quit date: 11/23/2015  . Smokeless tobacco: Never Used  . Alcohol use No     Allergies   Patient has no known allergies.   Review of Systems Review of Systems Ten systems reviewed and are negative for acute change, except as noted in the HPI.    Physical Exam Updated Vital Signs BP (!) 141/82 (BP Location: Left Arm)   Pulse 97   Temp 98.4 F (36.9 C) (Oral)   Resp 16   Ht 5\' 10"  (1.778 m)   Wt 86.2 kg (190 lb)   SpO2 98%   BMI 27.26 kg/m   Physical Exam  Constitutional: He appears well-developed and well-nourished. No distress.  HENT:  Head: Normocephalic and atraumatic.  Eyes: Conjunctivae are normal. No scleral icterus.  Neck: Normal range of motion. Neck supple.  Cardiovascular: Normal rate, regular rhythm and  normal heart sounds.   Pulmonary/Chest: Effort normal and breath sounds normal. No respiratory distress.  Abdominal: Soft. There is no tenderness.  Genitourinary:  Genitourinary Comments: Normal male anatomy, circumcised, no lesions or discharge, no tenderness of the testicles or swelling. No inguinal lymphadenopathy  Musculoskeletal: He exhibits no edema.  Neurological: He is alert.  Skin: Skin is warm and dry. He is not diaphoretic.  Psychiatric: His behavior is normal.  Nursing note and vitals reviewed.    ED Treatments / Results  Labs (all labs ordered are listed, but only abnormal results are displayed) Labs Reviewed  URINALYSIS, ROUTINE W REFLEX MICROSCOPIC    EKG  EKG Interpretation None       Radiology No results found.  Procedures Procedures (including critical care time)  Medications Ordered in ED Medications - No data to display   Initial Impression / Assessment and Plan / ED Course  I have reviewed the triage vital signs and the nursing notes.  Pertinent labs & imaging results that were available during my care of the patient were reviewed by me and considered in my medical decision making (see chart for details).     Patient without physical evidence of urethral radius, however, continues to have dysuria.  For trichomoniasis with 2 g of by mouth Flagyl. Patient will be treated for potential prostatitis given the discharge with defecation, although he has no  pain at all. I have given signed out to PA Laveda Norman, who will assume care. Pending urinalysis. Patient should be discharged with 21 day course of doxycycline and [facility] follow-up. Otherwise, treat for infected urine with Cipro or Keflex.  Final diagnoses:  None    New Prescriptions New Prescriptions   No medications on file     Arthor Captain, PA-C 06/13/16 1620    Vanetta Mulders, MD 06/15/16 1049

## 2016-06-13 NOTE — ED Notes (Signed)
ED Provider at bedside. 

## 2019-02-18 ENCOUNTER — Encounter (HOSPITAL_BASED_OUTPATIENT_CLINIC_OR_DEPARTMENT_OTHER): Payer: Self-pay | Admitting: Emergency Medicine

## 2019-02-18 ENCOUNTER — Emergency Department (HOSPITAL_BASED_OUTPATIENT_CLINIC_OR_DEPARTMENT_OTHER)
Admission: EM | Admit: 2019-02-18 | Discharge: 2019-02-18 | Disposition: A | Discharge status: Home / Self Care | Payer: HRSA Program | Attending: Emergency Medicine | Admitting: Emergency Medicine

## 2019-02-18 ENCOUNTER — Other Ambulatory Visit: Payer: Self-pay

## 2019-02-18 DIAGNOSIS — B349 Viral infection, unspecified: Secondary | ICD-10-CM | POA: Diagnosis not present

## 2019-02-18 DIAGNOSIS — U071 COVID-19: Secondary | ICD-10-CM | POA: Diagnosis not present

## 2019-02-18 DIAGNOSIS — Z87891 Personal history of nicotine dependence: Secondary | ICD-10-CM | POA: Diagnosis not present

## 2019-02-18 DIAGNOSIS — R0981 Nasal congestion: Secondary | ICD-10-CM | POA: Diagnosis present

## 2019-02-18 LAB — SARS CORONAVIRUS 2 (TAT 6-24 HRS): SARS Coronavirus 2: POSITIVE — AB

## 2019-02-18 NOTE — ED Provider Notes (Signed)
[facility] Emergency Department Provider Note MRN:  [id number]  Arrival date & time: 02/18/19     Chief Complaint   COVID symptoms   History of Present Illness   Omar Miller is a 39 y.o. year-old male with no pertinent past medical history presenting to the ED with chief complaint of chills.  2 days of nasal congestion, body aches, headaches, general malaise, chills.  Mild cough.  No sore throat.  No chest pain or shortness of breath, no abdominal pain.  2 or 3 episodes of diarrhea yesterday.  Stayed the weekend with a friend who had a close contact Covid exposure at work.  Review of Systems  A complete 10 system review of systems was obtained and all systems are negative except as noted in the HPI and PMH.   Patient's Health History   History reviewed. No pertinent past medical history.  History reviewed. No pertinent surgical history.  No family history on file.  Social History   Socioeconomic History  . Marital status: Single    Spouse name: Not on file  . Number of children: Not on file  . Years of education: Not on file  . Highest education level: Not on file  Occupational History  . Not on file  Tobacco Use  . Smoking status: Former Smoker    Quit date: 11/23/2015    Years since quitting: 3.2  . Smokeless tobacco: Never Used  Substance and Sexual Activity  . Alcohol use: No  . Drug use: Yes    Types: Marijuana    Comment: occ  . Sexual activity: Not on file  Other Topics Concern  . Not on file  Social History Narrative  . Not on file   Social Determinants of Health   Financial Resource Strain:   . Difficulty of Paying Living Expenses: Not on file  Food Insecurity:   . Worried About Charity fundraiser in the Last Year: Not on file  . Ran Out of Food in the Last Year: Not on file  Transportation Needs:   . Lack of Transportation (Medical): Not on file  . Lack of Transportation (Non-Medical): Not on file  Physical  Activity:   . Days of Exercise per Week: Not on file  . Minutes of Exercise per Session: Not on file  Stress:   . Feeling of Stress : Not on file  Social Connections:   . Frequency of Communication with Friends and Family: Not on file  . Frequency of Social Gatherings with Friends and Family: Not on file  . Attends Religious Services: Not on file  . Active Member of Clubs or Organizations: Not on file  . Attends Archivist Meetings: Not on file  . Marital Status: Not on file  Intimate Partner Violence:   . Fear of Current or Ex-Partner: Not on file  . Emotionally Abused: Not on file  . Physically Abused: Not on file  . Sexually Abused: Not on file     Physical Exam   Vitals:   02/18/19 0723  BP: 130/83  Pulse: 73  Resp: 18  Temp: 99.2 F (37.3 C)  SpO2: 99%    CONSTITUTIONAL: Well-appearing, NAD NEURO:  Alert and oriented x 3, no focal deficits EYES:  eyes equal and reactive ENT/NECK:  no LAD, no JVD CARDIO: Regular rate, well-perfused, normal S1 and S2 PULM:  CTAB no wheezing or rhonchi GI/GU:  normal bowel sounds, non-distended, non-tender MSK/SPINE:  No gross deformities, no edema SKIN:  no rash, atraumatic PSYCH:  Appropriate speech and behavior  *Additional and/or pertinent findings included in MDM below  Diagnostic and Interventional Summary    EKG Interpretation  Date/Time:    Ventricular Rate:    PR Interval:    QRS Duration:   QT Interval:    QTC Calculation:   R Axis:     Text Interpretation:        Cardiac Monitoring Interpretation:  Labs Reviewed  SARS CORONAVIRUS 2 (TAT 6-24 HRS)    No orders to display    Medications - No data to display   Procedures  /  Critical Care Procedures  ED Course and Medical Decision Making  I have reviewed the triage vital signs, the nursing notes, and pertinent available records from the EMR.  Pertinent labs & imaging results that were available during my care of the patient were reviewed by  me and considered in my medical decision making (see below for details).     Reassuring vital signs and general appearance, no increased work of breathing, no hypoxia, clear lungs, benign abdomen, myriad of symptoms consistent with viral illness, possibly coronavirus.  Appropriate for home quarantine, Covid swab, strict return precautions.  Omar Miller was evaluated in Emergency Department on 02/18/2019 for the symptoms described in the history of present illness. He was evaluated in the context of the global COVID-19 pandemic, which necessitated consideration that the patient might be at risk for infection with the SARS-CoV-2 virus that causes COVID-19. Institutional protocols and algorithms that pertain to the evaluation of patients at risk for COVID-19 are in a state of rapid change based on information released by regulatory bodies including the CDC and federal and state organizations. These policies and algorithms were followed during the patient's care in the ED.     Omar Miller. Omar Miller, [place] [email or web address]  Final Clinical Impressions(s) / ED Diagnoses     ICD-10-CM   1. Viral illness  B34.9     ED Discharge Orders    None       Discharge Instructions Discussed with and Provided to Patient:     Discharge Instructions     You were evaluated in the Emergency Department and after careful evaluation, we did not find any emergent condition requiring admission or further testing in the hospital.  Your exam/testing today was overall reassuring.  Your symptoms seem to be due to a viral illness, possibly the coronavirus.  We have tested you for the coronavirus here in the Emergency Department.  Please isolate or quarantine at home until you receive a negative test result.  If positive, we recommend continued home quarantine per [facility] recommendations.   Please return to the Emergency Department if you experience any worsening  of your condition.  We encourage you to follow up with a primary care provider.  Thank you for allowing [place] to be a part of your care.       Maudie Flakes, MD 02/18/19 [phone]

## 2019-02-18 NOTE — ED Triage Notes (Signed)
Cough and congestion x 2 days 

## 2019-02-18 NOTE — ED Notes (Signed)
Pt verbalized understanding of dc instructions.

## 2019-02-18 NOTE — Discharge Instructions (Signed)
You were evaluated in the Emergency Department and after careful evaluation, we did not find any emergent condition requiring admission or further testing in the hospital. ° °Your exam/testing today was overall reassuring.  Your symptoms seem to be due to a viral illness, possibly the coronavirus.  We have tested you for the coronavirus here in the Emergency Department.  Please isolate or quarantine at home until you receive a negative test result.  If positive, we recommend continued home quarantine per CDC recommendations. ° ° °Please return to the Emergency Department if you experience any worsening of your condition.  We encourage you to follow up with a primary care provider.  Thank you for allowing us to be a part of your care. ° °

## 2019-06-29 ENCOUNTER — Emergency Department (HOSPITAL_BASED_OUTPATIENT_CLINIC_OR_DEPARTMENT_OTHER)
Admission: EM | Admit: 2019-06-29 | Discharge: 2019-06-29 | Disposition: A | Discharge status: Home / Self Care | Payer: Self-pay | Attending: Emergency Medicine | Admitting: Emergency Medicine

## 2019-06-29 ENCOUNTER — Encounter (HOSPITAL_BASED_OUTPATIENT_CLINIC_OR_DEPARTMENT_OTHER): Payer: Self-pay | Admitting: *Deleted

## 2019-06-29 ENCOUNTER — Other Ambulatory Visit: Payer: Self-pay

## 2019-06-29 DIAGNOSIS — B349 Viral infection, unspecified: Secondary | ICD-10-CM | POA: Insufficient documentation

## 2019-06-29 DIAGNOSIS — F17210 Nicotine dependence, cigarettes, uncomplicated: Secondary | ICD-10-CM | POA: Insufficient documentation

## 2019-06-29 DIAGNOSIS — Y990 Civilian activity done for income or pay: Secondary | ICD-10-CM | POA: Insufficient documentation

## 2019-06-29 DIAGNOSIS — Y9289 Other specified places as the place of occurrence of the external cause: Secondary | ICD-10-CM | POA: Insufficient documentation

## 2019-06-29 DIAGNOSIS — X500XXA Overexertion from strenuous movement or load, initial encounter: Secondary | ICD-10-CM | POA: Insufficient documentation

## 2019-06-29 DIAGNOSIS — Y9389 Activity, other specified: Secondary | ICD-10-CM | POA: Insufficient documentation

## 2019-06-29 DIAGNOSIS — S39012A Strain of muscle, fascia and tendon of lower back, initial encounter: Secondary | ICD-10-CM | POA: Insufficient documentation

## 2019-06-29 DIAGNOSIS — J069 Acute upper respiratory infection, unspecified: Secondary | ICD-10-CM | POA: Insufficient documentation

## 2019-06-29 MED ORDER — KETOROLAC TROMETHAMINE 60 MG/2ML IM SOLN
30.0000 mg | Freq: Once | INTRAMUSCULAR | Status: AC
  Administered 2019-06-29: 08:00:00 30 mg via INTRAMUSCULAR
  Filled 2019-06-29: qty 2

## 2019-06-29 MED ORDER — CYCLOBENZAPRINE HCL 10 MG PO TABS: 10.0000 mg | ORAL_TABLET | Freq: Two times a day (BID) | ORAL | 0 refills | Status: DC | PRN

## 2019-06-29 MED ORDER — NAPROXEN 500 MG PO TABS: 500.0000 mg | ORAL_TABLET | Freq: Two times a day (BID) | ORAL | 0 refills | Status: DC | PRN

## 2019-06-29 NOTE — Discharge Instructions (Signed)
Recommend rest, alternate ice and warm compresses to affected area.  Recommend trial of anti-inflammatories as well as muscle relaxers.  Note the muscle relaxant can make you drowsy should not be taken with driving or operating heavy machinery.  If you develop worsening pain, numbness, weakness, fevers or other new concerning symptom, recommend return to ER for reassessment.

## 2019-06-29 NOTE — ED Triage Notes (Signed)
Knot in my back for 3-4 weeks and nasal congestion.

## 2019-06-30 NOTE — ED Provider Notes (Signed)
[facility] EMERGENCY DEPARTMENT Provider Note   CSN: [id number] Arrival date & time: 06/29/19  3154     History Chief Complaint  Patient presents with   Nasal Congestion   Back Pain    Omar Miller is a 39 y.o. male.  Presents to ER with 2 complaints.  Nasal congestion and back pain.  Has had increased congestion over the last few days, however has not had any associated cough, difficulty breathing, fever, body aches.  No alleviating or aggravating factors.  No known sick contacts.  Regarding back pain, has had low back pain worse on both sides over the past few days but also had some pain a few weeks ago.  No numbness, weakness, bladder or bowel incontinence, urinary retention, IVDU, fever.  For his back patient, frequently heavy lifting, on his feet regularly.  Requests work note.   HPI     History reviewed. No pertinent past medical history.  There are no problems to display for this patient.   Past Surgical History:  Procedure Laterality Date   arm surgery         History reviewed. No pertinent family history.  Social History   Tobacco Use   Smoking status: Current Every Day Smoker    Packs/day: 0.50    Types: Cigarettes    Last attempt to quit: 11/23/2015    Years since quitting: 3.6   Smokeless tobacco: Never Used  Substance Use Topics   Alcohol use: Yes    Comment: occasionally   Drug use: Yes    Types: Marijuana    Comment: today    Home Medications Prior to Admission medications   Medication Sig Start Date End Date Taking? Authorizing Provider  cyclobenzaprine (FLEXERIL) 10 MG tablet Take 1 tablet (10 mg total) by mouth 2 (two) times daily as needed for muscle spasms. 06/29/19   Lucrezia Starch, MD  naproxen (NAPROSYN) 500 MG tablet Take 1 tablet (500 mg total) by mouth 2 (two) times daily as needed for moderate pain. 06/29/19   Lucrezia Starch, MD    Allergies    Patient has no known allergies.  Review of Systems     Review of Systems  Constitutional: Negative for chills and fever.  HENT: Positive for postnasal drip. Negative for ear pain and sore throat.   Eyes: Negative for pain and visual disturbance.  Respiratory: Negative for cough and shortness of breath.   Cardiovascular: Negative for chest pain and palpitations.  Gastrointestinal: Negative for abdominal pain and vomiting.  Genitourinary: Negative for dysuria and hematuria.  Musculoskeletal: Positive for back pain. Negative for arthralgias.  Skin: Negative for color change and rash.  Neurological: Negative for seizures and syncope.  All other systems reviewed and are negative.   Physical Exam Updated Vital Signs BP 113/79 (BP Location: Right Arm)    Pulse 83    Temp 98.3 F (36.8 C) (Oral)    Resp 16    Ht 5\' 10"  (1.778 m)    Wt 77.1 kg    SpO2 96%    BMI 24.39 kg/m   Physical Exam Vitals and nursing note reviewed.  Constitutional:      Appearance: He is well-developed.  HENT:     Head: Normocephalic and atraumatic.  Eyes:     Conjunctiva/sclera: Conjunctivae normal.  Cardiovascular:     Rate and Rhythm: Normal rate and regular rhythm.     Heart sounds: No murmur heard.   Pulmonary:     Effort: Pulmonary effort  is normal. No respiratory distress.     Breath sounds: Normal breath sounds.  Abdominal:     Palpations: Abdomen is soft.     Tenderness: There is no abdominal tenderness.  Musculoskeletal:        General: No deformity or signs of injury.     Cervical back: Neck supple.     Comments: No midline C, T, L spine TTP, there is some tenderness in lateral posterior right and left sides of lumbar region. No deformity or swelling noted.   Skin:    General: Skin is warm and dry.  Neurological:     Mental Status: He is alert.     Comments: Normal strength and sensation to lower exremities     ED Results / Procedures / Treatments   Labs (all labs ordered are listed, but only abnormal results are displayed) Labs Reviewed -  No data to display  EKG None  Radiology No results found.  Procedures Procedures (including critical care time)  Medications Ordered in ED Medications  ketorolac (TORADOL) injection 30 mg (30 mg Intramuscular Given 06/29/19 0820)    ED Course  I have reviewed the triage vital signs and the nursing notes.  Pertinent labs & imaging results that were available during my care of the patient were reviewed by me and considered in my medical decision making (see chart for details).    MDM Rules/Calculators/A&P                          39 year old male presented to ER with concern for low back pain, nasal congestion.  Regarding nasal congestion, patient was quite well-appearing, no other associated symptoms, afebrile, may have mild viral illness.  Do not feel this warrants any further investigation or treatment today.  Recommend OTC decongestions as needed.  Regarding back pain, no deformity noted, neurovascularly intact.  No red flag symptoms.  No trauma.  Do not feel patient warrants imaging today.  Suspect MSK strain.  Recommend trial NSAIDs, muscle relaxers, follow-up with primary doctor.    After the discussed management above, the patient was determined to be safe for discharge.  The patient was in agreement with this plan and all questions regarding their care were answered.  ED return precautions were discussed and the patient will return to the ED with any significant worsening of condition.    Final Clinical Impression(s) / ED Diagnoses Final diagnoses:  Strain of lumbar region, initial encounter  Viral upper respiratory illness    Rx / DC Orders ED Discharge Orders         Ordered    naproxen (NAPROSYN) 500 MG tablet  2 times daily PRN     Discontinue  Reprint     06/29/19 0811    cyclobenzaprine (FLEXERIL) 10 MG tablet  2 times daily PRN     Discontinue  Reprint     06/29/19 8264           Milagros Loll, MD 06/30/19 1159

## 2019-07-02 ENCOUNTER — Emergency Department (HOSPITAL_BASED_OUTPATIENT_CLINIC_OR_DEPARTMENT_OTHER)
Admission: EM | Admit: 2019-07-02 | Discharge: 2019-07-02 | Disposition: A | Discharge status: Home / Self Care | Payer: Self-pay | Attending: Emergency Medicine | Admitting: Emergency Medicine

## 2019-07-02 ENCOUNTER — Encounter (HOSPITAL_BASED_OUTPATIENT_CLINIC_OR_DEPARTMENT_OTHER): Payer: Self-pay

## 2019-07-02 ENCOUNTER — Other Ambulatory Visit: Payer: Self-pay

## 2019-07-02 DIAGNOSIS — F17210 Nicotine dependence, cigarettes, uncomplicated: Secondary | ICD-10-CM | POA: Insufficient documentation

## 2019-07-02 DIAGNOSIS — K921 Melena: Secondary | ICD-10-CM | POA: Insufficient documentation

## 2019-07-02 DIAGNOSIS — K5900 Constipation, unspecified: Secondary | ICD-10-CM | POA: Insufficient documentation

## 2019-07-02 DIAGNOSIS — M25512 Pain in left shoulder: Secondary | ICD-10-CM | POA: Insufficient documentation

## 2019-07-02 DIAGNOSIS — G8929 Other chronic pain: Secondary | ICD-10-CM | POA: Insufficient documentation

## 2019-07-02 DIAGNOSIS — R1310 Dysphagia, unspecified: Secondary | ICD-10-CM

## 2019-07-02 LAB — CBC
HCT: 41 % (ref 39.0–52.0)
Hemoglobin: 13.8 g/dL (ref 13.0–17.0)
MCH: 29.2 pg (ref 26.0–34.0)
MCHC: 33.7 g/dL (ref 30.0–36.0)
MCV: 86.9 fL (ref 80.0–100.0)
Platelets: 240 10*3/uL (ref 150–400)
RBC: 4.72 MIL/uL (ref 4.22–5.81)
RDW: 13.1 % (ref 11.5–15.5)
WBC: 6.5 10*3/uL (ref 4.0–10.5)
nRBC: 0 % (ref 0.0–0.2)

## 2019-07-02 LAB — OCCULT BLOOD X 1 CARD TO LAB, STOOL: Fecal Occult Bld: POSITIVE — AB

## 2019-07-02 MED ORDER — OMEPRAZOLE 20 MG PO CPDR: 20.0000 mg | DELAYED_RELEASE_CAPSULE | Freq: Every day | ORAL | 0 refills | Status: DC

## 2019-07-02 MED ORDER — DOCUSATE SODIUM 250 MG PO CAPS: 250.0000 mg | ORAL_CAPSULE | Freq: Every day | ORAL | 0 refills | Status: DC

## 2019-07-02 MED ORDER — SUCRALFATE 1 G PO TABS: 1.0000 g | ORAL_TABLET | Freq: Three times a day (TID) | ORAL | 0 refills | Status: DC

## 2019-07-02 NOTE — ED Triage Notes (Signed)
Pt arrives with c/o trouble swallowing states he feels like something is stuck in his throat. Pt reports he has been having this problem for some time. Has never seen a GI doctor. Pt also c/o shoulder pain X1-2 months.

## 2019-07-02 NOTE — ED Notes (Signed)
ED Provider at bedside. 

## 2019-07-02 NOTE — Discharge Instructions (Addendum)
Your work-up today was overall reassuring.  Please make sure to follow-up with the gastroenterology (stomach) doctors for your difficulty swallowing.  Please take over-the-counter omeprazole (Prilosec)for your gastric reflux.  Please take Tylenol or ibuprofen for your shoulder pain.  In terms of your constipation, please take MiraLAX and Colace  to help with loosening your bowels.  I have provided the follow-up information for the GI doctor, sports medicine doctor for your left shoulder and contact information for [facility] which is a free clinic.  It is important to establish with a primary care doctor in order to manage your chronic health problems.  Return to the ER if your symptoms worsen.

## 2019-07-02 NOTE — ED Provider Notes (Signed)
[facility] EMERGENCY DEPARTMENT Provider Note   CSN: [id number] Arrival date & time: 07/02/19  1144     History Chief Complaint  Patient presents with  . trouble swallowing    Omar Miller is a 39 y.o. male.  HPI 39 year old male no significant medical presents with multiple complaints.  His main complaint is that he is having trouble swallowing which has been a chronic problem.  At times he says that when he eats, his food will come back up.  He says that he has to chew his food for a long time and take small baby swallows.  He states that he is able to tolerate liquids well.  He has not been having any difficulty eating or drinking in the last few days.  Denies any throat closing or shortness of breath, no rashes.  He states that he had a tube put down his throat 6 or 7 years ago and was told to take some omeprazole.  He states that he has not been taking anything for this.  No chest pain.  He also complains of left shoulder pain which is also chronic.  He states that sometimes when he bends over or lifts things he will feel up pain in his muscle.  He says sometimes he will get a shooting pain down his left arm but this is not constant.  He denies any numbness or tingling presently.  He does not recall any falls or injuries, no history of dislocations.  He also complains of chronic constipation, and blood in his stool for several months.  He has not taken anything for his constipation.  He denies any current nausea or vomiting.  No fevers or chills.  No chest pain or shortness of breath.  No headaches.    History reviewed. No pertinent past medical history.  There are no problems to display for this patient.   Past Surgical History:  Procedure Laterality Date  . arm surgery         No family history on file.  Social History   Tobacco Use  . Smoking status: Current Every Day Smoker    Packs/day: 0.50    Types: Cigarettes    Last attempt to quit: 11/23/2015     Years since quitting: 3.6  . Smokeless tobacco: Never Used  Substance Use Topics  . Alcohol use: Yes    Comment: occasionally  . Drug use: Yes    Types: Marijuana    Comment: today    Home Medications Prior to Admission medications   Medication Sig Start Date End Date Taking? Authorizing Provider  cyclobenzaprine (FLEXERIL) 10 MG tablet Take 1 tablet (10 mg total) by mouth 2 (two) times daily as needed for muscle spasms. 06/29/19   Lucrezia Starch, MD  docusate sodium (COLACE) 250 MG capsule Take 1 capsule (250 mg total) by mouth daily. 07/02/19   Garald Balding, PA-C  naproxen (NAPROSYN) 500 MG tablet Take 1 tablet (500 mg total) by mouth 2 (two) times daily as needed for moderate pain. 06/29/19   Lucrezia Starch, MD  omeprazole (PRILOSEC) 20 MG capsule Take 1 capsule (20 mg total) by mouth daily. 07/02/19   Garald Balding, PA-C  sucralfate (CARAFATE) 1 g tablet Take 1 tablet (1 g total) by mouth 4 (four) times daily -  with meals and at bedtime. 07/02/19 07/02/19  Garald Balding, PA-C    Allergies    Patient has no known allergies.  Review of Systems  Review of Systems  Constitutional: Negative for chills and fever.  HENT: Positive for trouble swallowing. Negative for ear pain, facial swelling and sore throat.   Eyes: Negative for pain and visual disturbance.  Respiratory: Negative for cough and shortness of breath.   Cardiovascular: Negative for chest pain and palpitations.  Gastrointestinal: Positive for anal bleeding, blood in stool and constipation. Negative for abdominal pain, nausea and vomiting.  Genitourinary: Negative for dysuria and hematuria.  Musculoskeletal: Positive for arthralgias (Left shoulder pain). Negative for back pain.  Skin: Negative for color change and rash.  Neurological: Negative for dizziness, seizures, syncope, weakness, numbness and headaches.  Psychiatric/Behavioral: Negative for confusion.  All other systems reviewed and are  negative.   Physical Exam Updated Vital Signs BP 128/88 (BP Location: Right Arm)   Pulse 84   Temp 98.1 F (36.7 C) (Oral)   Resp 18   Ht 5\' 10"  (1.778 m)   Wt 77.1 kg   SpO2 99%   BMI 24.39 kg/m   Physical Exam Vitals and nursing note reviewed. Exam conducted with a chaperone present.  Constitutional:      Appearance: He is well-developed.  HENT:     Head: Normocephalic and atraumatic.     Nose: Nose normal.     Mouth/Throat:     Mouth: Mucous membranes are moist.     Pharynx: Oropharynx is clear.     Comments: Throat nonerythematous, uvula midline.  No tonsillar exudate or swelling.  Normal tongue size, no sublingual/ submandibular swelling.  No tripoding or drooling.  No trismus.  Eyes:     Conjunctiva/sclera: Conjunctivae normal.     Pupils: Pupils are equal, round, and reactive to light.  Cardiovascular:     Rate and Rhythm: Normal rate and regular rhythm.     Pulses: Normal pulses.     Heart sounds: Normal heart sounds. No murmur heard.      Comments: No midline tenderness to the C-spine. Pulmonary:     Effort: Pulmonary effort is normal. No respiratory distress.     Breath sounds: Normal breath sounds.  Abdominal:     General: Bowel sounds are normal.     Palpations: Abdomen is soft.     Tenderness: There is no abdominal tenderness.     Comments: Abdomen soft and nontender.  Genitourinary:    Rectum: Normal.     Comments: Rectal exam without obvious hemorrhoids, fissures.  Scant red blood in stool. Musculoskeletal:        General: No swelling or tenderness.     Cervical back: Normal range of motion and neck supple.     Comments: 5/5 grip strength, gross sensations intact in upper extremities bilaterally.  Full range of motion of fingers.  2+ radial pulses bilaterally.  Full range of motion of shoulder joint.  Skin:    General: Skin is warm and dry.     Capillary Refill: Capillary refill takes less than 2 seconds.  Neurological:     General: No focal  deficit present.     Mental Status: He is alert and oriented to person, place, and time.     Sensory: No sensory deficit.     Motor: No weakness.  Psychiatric:        Mood and Affect: Mood normal.        Behavior: Behavior normal.     ED Results / Procedures / Treatments   Labs (all labs ordered are listed, but only abnormal results are displayed) Labs Reviewed  OCCULT BLOOD  X 1 CARD TO LAB, STOOL - Abnormal; Notable for the following components:      Result Value   Fecal Occult Bld POSITIVE (*)    All other components within normal limits  CBC  POC OCCULT BLOOD, ED    EKG None  Radiology No results found.  Procedures Procedures (including critical care time)  Medications Ordered in ED Medications - No data to display  ED Course  I have reviewed the triage vital signs and the nursing notes.  Pertinent labs & imaging results that were available during my care of the patient were reviewed by me and considered in my medical decision making (see chart for details).    MDM Rules/Calculators/A&P                         39 year old male with multiple complaints On presentation, the patient is alert and oriented, nontoxic-appearing, no acute distress.  Vitals overall reassuring.  Physical exam with soft nontender abdomen, no noticeable hemorrhoids, prolapses, profuse bleeding noted on exam.  Scant blood on stool.  Full range of motion of left shoulder, gross sensations intact.  2+ radial pulses.  Throat exam normal, patient tolerated p.o. challenge well without any subsequent vomiting.  Physical exam overall reassuring.  In terms of his left shoulder, doubt any dislocation or fractures as the patient states that he has not had any injuries.  Full range of motion of left shoulder joint is reassuring. Gross sensation and 5/5 grip strength.  He would benefit potentially from further outpatient follow-up but there is no emergent reason for imaging here in the ED today  His abdomen is  soft and nontender, he denies any current nausea or vomiting, doubt SBO/LBO, appendicitis, or any other intra-abdominal pathology.  I do not think he needs any additional imaging or lab work at this time.  Hemoccult positive. CBC w/ normal hemoglobin.   In terms of his difficulty swallowing, he is not having any respiratory distress, and was able to tolerate water in the ER well without subsequent vomitting.  He states that he was scoped 6 or 7 years ago and was told to take a PPI but has not been taking them or followed up with GI. Encouraged restarting PPI and carafate. Script provided.  Will refer him for GI for further work-up of this.  Per chart review, patient has been seen by [facility] orthopedics, I encouraged him to follow-up with them and also provided the contact information for Clare Gandy with sports medicine.  Encouraged MiraLAX and Colace for constipation, Tylenol/ibuprofen for left shoulder pain.  Encouraged him to establish with a primary care doctor, [place] and wellness contact information provided.  All the patient's questions have been answered to his satisfaction, he voices understanding and is agreeable to the plan.  At this stage in the ED course, the patient is medically screened and is stable for discharge.  Final Clinical Impression(s) / ED Diagnoses Final diagnoses:  Blood in stool  Chronic left shoulder pain  Dysphagia, unspecified type    Rx / DC Orders ED Discharge Orders         Ordered    omeprazole (PRILOSEC) 20 MG capsule  Daily     Discontinue  Reprint     07/02/19 1331    sucralfate (CARAFATE) 1 g tablet  3 times daily with meals & bedtime,   Status:  Discontinued     Reprint     07/02/19 1331  docusate sodium (COLACE) 250 MG capsule  Daily     Discontinue  Reprint     07/02/19 1337           Leone Brand 07/02/19 1337    Little, Ambrose Finland, MD 07/03/19 [phone]

## 2019-07-02 NOTE — ED Notes (Signed)
Pt reports he has also been having some constipation and notices bright red blood when he does have a BM.

## 2019-09-07 ENCOUNTER — Other Ambulatory Visit: Payer: Self-pay

## 2019-09-07 ENCOUNTER — Encounter: Payer: Self-pay | Admitting: Internal Medicine

## 2019-09-07 ENCOUNTER — Ambulatory Visit: Payer: Self-pay | Attending: Internal Medicine | Admitting: Internal Medicine

## 2019-09-07 VITALS — BP 103/57 | HR 64 | Temp 97.7°F | Ht 69.0 in | Wt 157.0 lb

## 2019-09-07 DIAGNOSIS — K219 Gastro-esophageal reflux disease without esophagitis: Secondary | ICD-10-CM | POA: Insufficient documentation

## 2019-09-07 DIAGNOSIS — Z7689 Persons encountering health services in other specified circumstances: Secondary | ICD-10-CM

## 2019-09-07 DIAGNOSIS — R634 Abnormal weight loss: Secondary | ICD-10-CM

## 2019-09-07 DIAGNOSIS — Z1159 Encounter for screening for other viral diseases: Secondary | ICD-10-CM

## 2019-09-07 DIAGNOSIS — K625 Hemorrhage of anus and rectum: Secondary | ICD-10-CM

## 2019-09-07 DIAGNOSIS — R1319 Other dysphagia: Secondary | ICD-10-CM | POA: Insufficient documentation

## 2019-09-07 DIAGNOSIS — K5909 Other constipation: Secondary | ICD-10-CM

## 2019-09-07 DIAGNOSIS — Z2821 Immunization not carried out because of patient refusal: Secondary | ICD-10-CM

## 2019-09-07 DIAGNOSIS — S46002A Unspecified injury of muscle(s) and tendon(s) of the rotator cuff of left shoulder, initial encounter: Secondary | ICD-10-CM | POA: Insufficient documentation

## 2019-09-07 DIAGNOSIS — Z114 Encounter for screening for human immunodeficiency virus [HIV]: Secondary | ICD-10-CM

## 2019-09-07 DIAGNOSIS — R1310 Dysphagia, unspecified: Secondary | ICD-10-CM

## 2019-09-07 MED ORDER — OMEPRAZOLE 20 MG PO CPDR: 20.0000 mg | DELAYED_RELEASE_CAPSULE | Freq: Every day | ORAL | 4 refills | Status: AC

## 2019-09-07 MED ORDER — ACETAMINOPHEN 325 MG PO TABS: 650.0000 mg | ORAL_TABLET | Freq: Two times a day (BID) | ORAL | 2 refills | Status: AC | PRN

## 2019-09-07 MED ORDER — CYCLOBENZAPRINE HCL 5 MG PO TABS: 5.0000 mg | ORAL_TABLET | Freq: Two times a day (BID) | ORAL | 1 refills | Status: DC | PRN

## 2019-09-07 MED ORDER — POLYETHYLENE GLYCOL 3350 17 GM/SCOOP PO POWD: 17.0000 g | Freq: Every day | ORAL | 1 refills | Status: AC | PRN

## 2019-09-07 MED ORDER — POLYETHYLENE GLYCOL 3350 17 GM/SCOOP PO POWD: 17.0000 g | Freq: Two times a day (BID) | ORAL | 1 refills | Status: DC | PRN

## 2019-09-07 MED FILL — POLYETHYLENE GLYCOL 3350 PO: 17 | 15 days supply | Qty: 238 | Fill #0

## 2019-09-07 MED FILL — CYCLOBENZAPRINE 5 MG TABLET: 5 | 15 days supply | Qty: 30 | Fill #0

## 2019-09-07 MED FILL — OMEPRAZOLE 20 MG CAP: 20 | 30 days supply | Qty: 30 | Fill #0

## 2019-09-07 NOTE — Patient Instructions (Signed)

## 2019-09-07 NOTE — Progress Notes (Signed)
Patient ID: Omar Miller, male    DOB: 10-Dec-1980  MRN: [id number]  CC: New Patient (Initial Visit) (Pt. is here to establish care. Pt. would like to know if he can something to help him with his left shoulder pain. )   Subjective: Omar Miller is a 39 y.o. male who presents for new pt visit His concerns today include:  Pt with hx of tob dep  No previous PCP No chronic medical conditions.   C/o limited ROM LT shoulder x 3 mths.  Hurt when he pushes or lifts weight.  Feels it started 3 mths ago when he started doing some wgh training.  Was lifting 45 lbs dumb bells over head.  Was also doing shoulder presses. 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  Uses handtrucks and other equipment when moving furniture Hurts when she tries to elevated arm. -using Tylenol daily which he does not feel help.  Given Flexeril through the ER a few months ago for the same.  He found that helpful.  C/o blood in stools and when he wipes x 6 mths.  No rectal pain.  Sometimes he has sharp pains in the abdomen right before BM.  Seen in the emergency room for the same several weeks ago.  Rectal exam revealed no hemorrhoids or fissures.  He was Hemoccult positive.  - Endorses problems with swallowing solids sometimes x few mths.  Feels like food gets stuck in chest and he regurgitates.  Sometimes he has to drink fluids to help solid foods go down.  Thinks he had EGD 5-6 yrs ago and was told he had acid reflux.  Told to take Prilosec  Currently not taking but feels he still gets GERD.  Endorses constipation.  Sees blood more so when stools are hard.  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  Now down to 157 lb.  Reports his normal wgh prior to prison was 160 lbs.  Eating less that he was while incarcerated.    Past medical, social, family history and surgical history reviewed and updated in the system.  Current Outpatient Medications on File  Prior to Visit  Medication Sig Dispense Refill  . docusate sodium (COLACE) 250 MG capsule Take 1 capsule (250 mg total) by mouth daily. (Patient not taking: Reported on 09/07/2019) 10 capsule 0  . [DISCONTINUED] sucralfate (CARAFATE) 1 g tablet Take 1 tablet (1 g total) by mouth 4 (four) times daily -  with meals and at bedtime. 120 tablet 0   No current facility-administered medications on file prior to visit.    No Known Allergies  Social History   Socioeconomic History  . Marital status: Single    Spouse name: Not on file  . Number of children: 3  . Years of education: Not on file  . Highest education level: Not on file  Occupational History  . Not on file  Tobacco Use  . Smoking status: Current Every Day Smoker    Packs/day: 0.25    Types: Cigarettes  . Smokeless tobacco: Never Used  Vaping Use  . Vaping Use: Never used  Substance and Sexual Activity  . Alcohol use: Yes    Comment: occasionally  . Drug use: Yes    Frequency: 7.0 times per week    Types: Marijuana    Comment: daily  . Sexual activity: Not on file  Other Topics Concern  .  Not on file  Social History Narrative  . Not on file   Social Determinants of Health   Financial Resource Strain:   . Difficulty of Paying Living Expenses: Not on file  Food Insecurity:   . Worried About Programme researcher, broadcasting/film/videounning Out of Food in the Last Year: Not on file  . Ran Out of Food in the Last Year: Not on file  Transportation Needs:   . Lack of Transportation (Medical): Not on file  . Lack of Transportation (Non-Medical): Not on file  Physical Activity:   . Days of Exercise per Week: Not on file  . Minutes of Exercise per Session: Not on file  Stress:   . Feeling of Stress : Not on file  Social Connections:   . Frequency of Communication with Friends and Family: Not on file  . Frequency of Social Gatherings with Friends and Family: Not on file  . Attends Religious Services: Not on file  . Active Member of Clubs or Organizations:  Not on file  . Attends BankerClub or Organization Meetings: Not on file  . Marital Status: Not on file  Intimate Partner Violence:   . Fear of Current or Ex-Partner: Not on file  . Emotionally Abused: Not on file  . Physically Abused: Not on file  . Sexually Abused: Not on file    Family History  Problem Relation Age of Onset  . Diabetes Mother   . Hypertension Mother   . Prostate cancer Paternal Grandfather     Past Surgical History:  Procedure Laterality Date  . arm surgery    . EXTENSOR TENDON OF FOREARM / WRIST REPAIR  2018    ROS: Review of Systems Negative except as stated above  PHYSICAL EXAM: BP (!) 103/57 (BP Location: Left Arm, Patient Position: Sitting, Cuff Size: Normal)   Pulse 64   Temp 97.7 F (36.5 C) (Temporal)   Ht 5\' 9"  (1.753 m)   Wt 157 lb (71.2 kg)   SpO2 98%   BMI 23.18 kg/m   Wt Readings from Last 3 Encounters:  09/07/19 157 lb (71.2 kg)  07/02/19 170 lb (77.1 kg)  06/29/19 170 lb (77.1 kg)   Physical Exam  General appearance - alert, well appearing, middle-aged African-American male and in no distress Mental status - normal mood, behavior, speech, dress, motor activity, and thought processes Eyes - pupils equal and reactive, extraocular eye movements intact Nose - normal and patent, no erythema, discharge or polyps Mouth - mucous membranes moist, pharynx normal without lesions Chest - clear to auscultation, no wheezes, rales or rhonchi, symmetric air entry Heart - normal rate, regular rhythm, normal S1, S2, no murmurs, rubs, clicks or gallops Abdomen - soft, nontender, nondistended, no masses or organomegaly Rectal: No external hemorrhoids.  No tears seen. Musculoskeletal -left shoulder: No point tenderness.  Drop arm test positive.  He has moderate discomfort with attempted passive range of motion in all direction  extremities - peripheral pulses normal, no pedal edema, no clubbing or cyanosis  CMP Latest Ref Rng & Units 09/25/2009  Glucose  70 - 99 mg/dL 161(W123(H)  BUN 6 - 23 mg/dL 20  Creatinine 9.600.40 - 4.541.50 mg/dL 1.1  Sodium 098135 - 119145 mEq/L 142  Potassium 3.5 - 5.1 mEq/L 3.9  Chloride 96 - 112 mEq/L 104  CO2 19 - 32 mEq/L 30  Calcium 8.4 - 10.5 mg/dL 9.7  Total Protein 6.0 - 8.3 g/dL 8.0  Total Bilirubin 0.3 - 1.2 mg/dL 1.2  Alkaline Phos 39 - 117  U/L 84  AST 0 - 37 U/L 32  ALT 0 - 53 U/L 37   Lipid Panel  No results found for: CHOL, TRIG, HDL, CHOLHDL, VLDL, LDLCALC, LDLDIRECT  CBC    Component Value Date/Time   WBC 6.5 07/02/2019 1318   RBC 4.72 07/02/2019 1318   HGB 13.8 07/02/2019 1318   HCT 41.0 07/02/2019 1318   PLT 240 07/02/2019 1318   MCV 86.9 07/02/2019 1318   MCH 29.2 07/02/2019 1318   MCHC 33.7 07/02/2019 1318   RDW 13.1 07/02/2019 1318   LYMPHSABS 2.2 09/25/2009 0715   MONOABS 0.8 09/25/2009 0715   EOSABS 0.1 09/25/2009 0715   BASOSABS 0.0 09/25/2009 0715    ASSESSMENT AND PLAN:  1. Encounter to establish care 2. Injury of left rotator cuff, initial encounter Exam suggest that he probably has rotator cuff injury. Advised to apply for the orange card/cone discount so that we can refer to orthopedics. In the meantime he will take Tylenol as needed.  Refill given on Flexeril. Advised to avoid heavy lifting but this is inevitable with the type of work that he does. - cyclobenzaprine (FLEXERIL) 5 MG tablet; Take 1 tablet (5 mg total) by mouth 2 (two) times daily as needed for muscle spasms.  Dispense: 30 tablet; Refill: 1 - acetaminophen (TYLENOL) 325 MG tablet; Take 2 tablets (650 mg total) by mouth 2 (two) times daily as needed.  Dispense: 60 tablet; Refill: 2  3. Blood per rectum Patient's constellation of symptoms of weight loss, dysphagia to solids, and rectal bleeding are very concerning.  Advised him to apply for the orange card/cone discount ASAP so that we can refer him to GI for EGD and colonoscopy.  4. Other constipation Advised to drink several glasses of water daily.  Increase fiber  in the diet.  Start MiraLAX. - polyethylene glycol powder (GLYCOLAX/MIRALAX) 17 GM/SCOOP powder; Take 17 g by mouth daily as needed.  Dispense: 3350 g; Refill: 1  5. Esophageal dysphagia 6. Unexplained weight loss See #3 above. - Hemoglobin A1c - TSH  7. Gastroesophageal reflux disease without esophagitis GERD precautions discussed.  Advised patient to avoid certain foods like spicy foods, orange juice and oranges and tomato-based foods.  Advised to eat his last meal at least 2 to 3 hours before laying down at nighttime to sleep with his head a little elevated.  Start Prilosec.  Hold off on NSAIDs for now. - CBC - Comprehensive metabolic panel - Lipid panel - omeprazole (PRILOSEC) 20 MG capsule; Take 1 capsule (20 mg total) by mouth daily.  Dispense: 30 capsule; Refill: 4  8. Encounter for screening for HIV Patient agreeable for screening - Hepatitis C Antibody  9. Need for hepatitis C screening test Patient agreeable for screening - HIV antibody (with reflex)  10. Influenza vaccination declined This was offered and advised but patient declined.    Patient was given the opportunity to ask questions.  Patient verbalized understanding of the plan and was able to repeat key elements of the plan.   Orders Placed This Encounter  Procedures  . Hepatitis C Antibody  . HIV antibody (with reflex)  . CBC  . Comprehensive metabolic panel  . Lipid panel  . Hemoglobin A1c  . TSH     Requested Prescriptions   Signed Prescriptions Disp Refills  . cyclobenzaprine (FLEXERIL) 5 MG tablet 30 tablet 1    Sig: Take 1 tablet (5 mg total) by mouth 2 (two) times daily as needed for muscle spasms.  Marland Kitchen  omeprazole (PRILOSEC) 20 MG capsule 30 capsule 4    Sig: Take 1 capsule (20 mg total) by mouth daily.  Marland Kitchen acetaminophen (TYLENOL) 325 MG tablet 60 tablet 2    Sig: Take 2 tablets (650 mg total) by mouth 2 (two) times daily as needed.  . polyethylene glycol powder (GLYCOLAX/MIRALAX) 17 GM/SCOOP  powder 3350 g 1    Sig: Take 17 g by mouth daily as needed.    Return in about 6 weeks (around 10/19/2019).  Jonah Blue, MD, FACP

## 2019-09-08 ENCOUNTER — Encounter: Payer: Self-pay | Admitting: Internal Medicine

## 2019-09-08 DIAGNOSIS — R7303 Prediabetes: Secondary | ICD-10-CM | POA: Insufficient documentation

## 2019-09-08 LAB — COMPREHENSIVE METABOLIC PANEL
ALT: 13 IU/L (ref 0–44)
AST: 14 IU/L (ref 0–40)
Albumin/Globulin Ratio: 2.1 (ref 1.2–2.2)
Albumin: 3.6 g/dL — ABNORMAL LOW (ref 4.0–5.0)
Alkaline Phosphatase: 61 IU/L (ref 48–121)
BUN/Creatinine Ratio: 12 (ref 9–20)
BUN: 10 mg/dL (ref 6–24)
Bilirubin Total: 0.2 mg/dL (ref 0.0–1.2)
CO2: 27 mmol/L (ref 20–29)
Calcium: 8.7 mg/dL (ref 8.7–10.2)
Chloride: 105 mmol/L (ref 96–106)
Creatinine, Ser: 0.81 mg/dL (ref 0.76–1.27)
GFR calc Af Amer: 129 mL/min/{1.73_m2} (ref >59)
GFR calc non Af Amer: 111 mL/min/{1.73_m2} (ref >59)
Globulin, Total: 1.7 g/dL (ref 1.5–4.5)
Glucose: 92 mg/dL (ref 65–99)
Potassium: 4.2 mmol/L (ref 3.5–5.2)
Sodium: 141 mmol/L (ref 134–144)
Total Protein: 5.3 g/dL — ABNORMAL LOW (ref 6.0–8.5)

## 2019-09-08 LAB — LIPID PANEL
Chol/HDL Ratio: 4.6 ratio (ref 0.0–5.0)
Cholesterol, Total: 158 mg/dL (ref 100–199)
HDL: 34 mg/dL — ABNORMAL LOW (ref >39)
LDL Chol Calc (NIH): 97 mg/dL (ref 0–99)
Triglycerides: 153 mg/dL — ABNORMAL HIGH (ref 0–149)
VLDL Cholesterol Cal: 27 mg/dL (ref 5–40)

## 2019-09-08 LAB — HEMOGLOBIN A1C
Est. average glucose Bld gHb Est-mCnc: 128 mg/dL
Hgb A1c MFr Bld: 6.1 % — ABNORMAL HIGH (ref 4.8–5.6)

## 2019-09-08 LAB — CBC
Hematocrit: 39.9 % (ref 37.5–51.0)
Hemoglobin: 13.6 g/dL (ref 13.0–17.7)
MCH: 30 pg (ref 26.6–33.0)
MCHC: 34.1 g/dL (ref 31.5–35.7)
MCV: 88 fL (ref 79–97)
Platelets: 208 10*3/uL (ref 150–450)
RBC: 4.53 x10E6/uL (ref 4.14–5.80)
RDW: 13 % (ref 11.6–15.4)
WBC: 6.7 10*3/uL (ref 3.4–10.8)

## 2019-09-08 LAB — HEPATITIS C ANTIBODY: Hep C Virus Ab: <0.1 s/co ratio (ref 0.0–0.9)

## 2019-09-08 LAB — HIV ANTIBODY (ROUTINE TESTING W REFLEX): HIV Screen 4th Generation wRfx: NONREACTIVE

## 2019-09-08 LAB — TSH: TSH: 0.381 u[IU]/mL — ABNORMAL LOW (ref 0.450–4.500)

## 2019-09-08 NOTE — Addendum Note (Signed)
Addended by: Jonah Blue B on: 09/08/2019 10:17 AM   Modules accepted: Orders

## 2019-09-08 NOTE — Progress Notes (Signed)
Let patient know that screening test for HIV and hepatitis C were negative.  Blood count revealed normal blood cell count numbers.  Cholesterol level good.  He has prediabetes.  This means he has risk for developing diabetes.  Healthy eating habits and regular exercise will help prevent progression to diabetes.  Thyroid level is low suggesting he may have over functioning thyroid.  This can cause unexplained weight loss.  I will have the lab do additional studies to confirm this diagnosis and will get back to him.

## 2019-09-09 ENCOUNTER — Telehealth: Payer: Self-pay

## 2019-09-09 NOTE — Telephone Encounter (Signed)
Contacted pt to go over lab results pt didn't answer lvm asking pt to give a call back at their earliest convenience

## 2019-09-10 LAB — T4, FREE: Free T4: 1.23 ng/dL (ref 0.82–1.77)

## 2019-09-10 LAB — T3, FREE: T3, Free: 2.9 pg/mL (ref 2.0–4.4)

## 2019-09-10 LAB — SPECIMEN STATUS REPORT

## 2019-11-03 ENCOUNTER — Other Ambulatory Visit: Payer: Self-pay

## 2019-11-03 ENCOUNTER — Encounter (HOSPITAL_BASED_OUTPATIENT_CLINIC_OR_DEPARTMENT_OTHER): Payer: Self-pay | Admitting: Emergency Medicine

## 2019-11-03 ENCOUNTER — Emergency Department (HOSPITAL_BASED_OUTPATIENT_CLINIC_OR_DEPARTMENT_OTHER)
Admission: EM | Admit: 2019-11-03 | Discharge: 2019-11-03 | Disposition: A | Discharge status: Home / Self Care | Payer: Self-pay | Attending: Emergency Medicine | Admitting: Emergency Medicine

## 2019-11-03 DIAGNOSIS — R202 Paresthesia of skin: Secondary | ICD-10-CM | POA: Insufficient documentation

## 2019-11-03 DIAGNOSIS — F17210 Nicotine dependence, cigarettes, uncomplicated: Secondary | ICD-10-CM | POA: Insufficient documentation

## 2019-11-03 DIAGNOSIS — M25532 Pain in left wrist: Secondary | ICD-10-CM | POA: Insufficient documentation

## 2019-11-03 HISTORY — DX: Other chronic pain: G89.29

## 2019-11-03 HISTORY — DX: Malingerer (conscious simulation): Z76.5

## 2019-11-03 HISTORY — DX: Cocaine abuse, uncomplicated: F14.10

## 2019-11-03 HISTORY — DX: Major depressive disorder, recurrent, unspecified: F33.9

## 2019-11-03 MED ORDER — KETOROLAC TROMETHAMINE 15 MG/ML IJ SOLN
30.0000 mg | Freq: Once | INTRAMUSCULAR | Status: AC
  Administered 2019-11-03: 30 mg via INTRAMUSCULAR
  Filled 2019-11-03: qty 2

## 2019-11-03 MED ORDER — PANTOPRAZOLE SODIUM 40 MG PO TBEC
40.0000 mg | DELAYED_RELEASE_TABLET | Freq: Once | ORAL | Status: AC
  Administered 2019-11-03: 40 mg via ORAL
  Filled 2019-11-03: qty 1

## 2019-11-03 MED ORDER — HYDROCODONE-ACETAMINOPHEN 5-325 MG PO TABS
1.0000 | ORAL_TABLET | Freq: Once | ORAL | Status: AC
  Administered 2019-11-03: 1 via ORAL
  Filled 2019-11-03: qty 1

## 2019-11-03 MED ORDER — NAPROXEN 500 MG PO TABS: ORAL_TABLET | ORAL | 0 refills | Status: DC

## 2019-11-03 NOTE — ED Provider Notes (Signed)
[facility] Provider Note: Omar Dell, MD, FACEP  CSN: [id number] MRN: [id number] ARRIVAL: 11/03/19 at 0222 ROOM: [facility]   CHIEF COMPLAINT  Wrist Pain   HISTORY OF PRESENT ILLNESS  11/03/19 2:40 AM Omar Miller is a 39 y.o. male who states he woke up this morning and is unable to move his left wrist due to pain.  He rates the pain in his wrist as a 9 out of 10.  It is more prominent on the volar surface.  He has numbness in the left thumb but not the left index or middle fingers.  He denies any trauma to his left wrist.  He denies any repetitive use of his left wrist.  He has no history of similar symptoms.  There is no associated swelling, redness or warmth.  He was tearful and inconsolable in triage.   Past Medical History:  Diagnosis Date  . Chronic left shoulder pain   . Cocaine use disorder (HCC)   . Major depressive disorder, recurrent (HCC)   . Malingering     Past Surgical History:  Procedure Laterality Date  . arm surgery    . EXTENSOR TENDON OF FOREARM / WRIST REPAIR  2018    Family History  Problem Relation Age of Onset  . Diabetes Mother   . Hypertension Mother   . Prostate cancer Paternal Grandfather     Social History   Tobacco Use  . Smoking status: Current Every Day Smoker    Packs/day: 1.00    Types: Cigarettes  . Smokeless tobacco: Never Used  Vaping Use  . Vaping Use: Never used  Substance Use Topics  . Alcohol use: Not Currently    Comment: occasionally  . Drug use: Not Currently    Frequency: 7.0 times per week    Types: Marijuana    Comment: daily    Prior to Admission medications   Medication Sig Start Date End Date Taking? Authorizing Provider  acetaminophen (TYLENOL) 325 MG tablet Take 2 tablets (650 mg total) by mouth 2 (two) times daily as needed. 09/07/19   Marcine Matar, MD  cyclobenzaprine (FLEXERIL) 5 MG tablet Take 1 tablet (5 mg total) by mouth 2 (two) times daily as needed for muscle spasms. 09/07/19    Marcine Matar, MD  naproxen (NAPROSYN) 500 MG tablet Take 1 tablet twice daily for wrist pain. 11/03/19   Tinesha Siegrist, MD  omeprazole (PRILOSEC) 20 MG capsule Take 1 capsule (20 mg total) by mouth daily. 09/07/19   Marcine Matar, MD  polyethylene glycol powder (GLYCOLAX/MIRALAX) 17 GM/SCOOP powder Take 17 g by mouth daily as needed. 09/07/19   Marcine Matar, MD  sucralfate (CARAFATE) 1 g tablet Take 1 tablet (1 g total) by mouth 4 (four) times daily -  with meals and at bedtime. 07/02/19 07/02/19  Mare Ferrari, PA-C    Allergies Patient has no known allergies.   REVIEW OF SYSTEMS  Negative except as noted here or in the History of Present Illness.   PHYSICAL EXAMINATION  Initial Vital Signs Blood pressure 120/63, pulse 83, temperature 98.3 F (36.8 C), temperature source Oral, resp. rate 16, height 5\' 9"  (1.753 m), weight 74.8 kg, SpO2 98 %.  Examination General: Well-developed, well-nourished male in no acute distress; appearance consistent with age of record HENT: normocephalic; atraumatic Eyes: Normal appearance Neck: supple Heart: regular rate and rhythm Lungs: clear to auscultation bilaterally Abdomen: soft; nondistended; nontender; bowel sounds present Extremities: No deformity; tenderness and decreased mobility  of left wrist, tenderness is most prominent over the volar service with a positive Tinel's test, there is no erythema, warmth or swelling of the left wrist, sensation is decreased in the left thumb but otherwise intact throughout the left hand with intact motor function and tendon function of all fingers and brisk capillary refill distally Neurologic: Awake, alert and oriented; motor function intact in all extremities and symmetric; no facial droop Skin: Warm and dry Psychiatric: Tearful   RESULTS  Summary of this visit's results, reviewed and interpreted by myself:   EKG Interpretation  Date/Time:    Ventricular Rate:    PR Interval:    QRS  Duration:   QT Interval:    QTC Calculation:   R Axis:     Text Interpretation:        Laboratory Studies: No results found for this or any previous visit (from the past 24 hour(s)). Imaging Studies: No results found.  ED COURSE and MDM  Nursing notes, initial and subsequent vitals signs, including pulse oximetry, reviewed and interpreted by myself.  Vitals:   11/03/19 0232 11/03/19 0238  BP:  120/63  Pulse:  83  Resp:  16  Temp:  98.3 F (36.8 C)  TempSrc:  Oral  SpO2:  98%  Weight: 74.8 kg   Height: 5\' 9"  (1.753 m)    Medications  ketorolac (TORADOL) 15 MG/ML injection 30 mg (has no administration in time range)  pantoprazole (PROTONIX) EC tablet 40 mg (has no administration in time range)  HYDROcodone-acetaminophen (NORCO/VICODIN) 5-325 MG per tablet 1 tablet (has no administration in time range)    The patient's pain could be due to carpal tunnel syndrome given the positive Tinel's test and decreased sensation of the left thumb but the onset was fairly severe.  This could be gout but he has no history of gout and there is no erythema, warmth or swelling of the left wrist to support this diagnosis.  He does not have a fever and again without erythema or warmth I have a low suspicion for septic joint.  We will place him in a splint and give him an injection of Toradol.  He has a history of substance abuse so I do not believe narcotics are in his best interest.  We will refer to hand surgery or have him return if symptoms worsen.  PROCEDURES  Procedures   ED DIAGNOSES     ICD-10-CM   1. Acute pain of left wrist  M25.532        Jeramy Dimmick, , MD 11/03/19 [phone]

## 2019-11-03 NOTE — ED Triage Notes (Signed)
Pt is c/o left wrist pain and states he is unable to move it  Pt states he could move it when he went to bed but when he woke up he could not   Pt is tearful in triage  Pt is able to wiggle his fingers on his left hand

## 2019-11-04 ENCOUNTER — Ambulatory Visit: Payer: Self-pay | Attending: Internal Medicine | Admitting: Internal Medicine

## 2019-11-04 ENCOUNTER — Other Ambulatory Visit: Payer: Self-pay | Admitting: Internal Medicine

## 2019-11-04 ENCOUNTER — Encounter: Payer: Self-pay | Admitting: Internal Medicine

## 2019-11-04 VITALS — BP 102/63 | HR 67 | Resp 16 | Wt 158.4 lb

## 2019-11-04 DIAGNOSIS — S46002A Unspecified injury of muscle(s) and tendon(s) of the rotator cuff of left shoulder, initial encounter: Secondary | ICD-10-CM

## 2019-11-04 DIAGNOSIS — Z2821 Immunization not carried out because of patient refusal: Secondary | ICD-10-CM

## 2019-11-04 DIAGNOSIS — K625 Hemorrhage of anus and rectum: Secondary | ICD-10-CM

## 2019-11-04 DIAGNOSIS — M25532 Pain in left wrist: Secondary | ICD-10-CM

## 2019-11-04 DIAGNOSIS — R7303 Prediabetes: Secondary | ICD-10-CM

## 2019-11-04 DIAGNOSIS — R634 Abnormal weight loss: Secondary | ICD-10-CM

## 2019-11-04 DIAGNOSIS — R7989 Other specified abnormal findings of blood chemistry: Secondary | ICD-10-CM

## 2019-11-04 MED ORDER — NAPROXEN 500 MG PO TABS: ORAL_TABLET | ORAL | 0 refills | Status: DC

## 2019-11-04 MED ORDER — CYCLOBENZAPRINE HCL 5 MG PO TABS: 5.0000 mg | ORAL_TABLET | Freq: Two times a day (BID) | ORAL | 1 refills | Status: DC | PRN

## 2019-11-04 MED FILL — CYCLOBENZAPRINE 5 MG TABLET: 5 | 15 days supply | Qty: 30 | Fill #0

## 2019-11-04 MED FILL — NAPROXEN 500 MG TABLET: 500 | 10 days supply | Qty: 20 | Fill #0

## 2019-11-04 NOTE — Progress Notes (Signed)
Patient ID: Omar Miller, male    DOB: [birth year]  MRN: [id number]  CC: Hospitalization Follow-up (ED)   Subjective: Omar Miller is a 39 y.o. male who presents for ER f/u. His concerns today include:   Patient presents as follow-up from the emergency room where he was seen yesterday for left wrist pain.  Reports waking up yesterday morning with pain in the wrist and had difficulty moving it.  This was associated with numbness in the thumb.  He feels the wrist is swollen.  Denies any trauma to the wrist.  He donates plasma 2 times a week.  While he is there he has to constantly do a squeezing motion of the hand for an hour while the blood product is being donated.  He thinks this may have contributed to the issue that he is having now.  He also does delivery and moving of furniture.  Prescribed Naprosyn from the emergency room but has not filled it yet.  He was given a wrist splint which he has found helpful.  On last visit, he complained of blood in the stools and when he wipes x6 months.  This was associated with a 40 pound weight loss from November of last year and dysphagia.  Plan is to refer to the gastroenterologist once he is approved for the orange card/cone discount.  He has an appointment next week with our financial counselor to see whether he can get approved for the orange card/cone discount.  On last visit he also complained of chronic pain in the left shoulder.  He was assessed to have left rotator cuff injury.  Plan is for referral to orthopedics once he is approved for the orange card/cone discount.  He was placed on Flexeril and Tylenol which he found helpful.  Found to have prediabetes on blood test done on last visit.  His mother has diabetes.  He did receive my message on counseling about healthy eating habits and regular exercise.  He states that he gets in plenty of exercise through his job.  TSH was found to be slightly abnormal.  Routine 3 and free T4 were  normal. HM:  Decline flu and Tdapt Patient Active Problem List   Diagnosis Date Noted  . Prediabetes 09/08/2019  . Injury of left rotator cuff 09/07/2019  . Blood per rectum 09/07/2019  . Other constipation 09/07/2019  . Esophageal dysphagia 09/07/2019  . Gastroesophageal reflux disease without esophagitis 09/07/2019     Current Outpatient Medications on File Prior to Visit  Medication Sig Dispense Refill  . acetaminophen (TYLENOL) 325 MG tablet Take 2 tablets (650 mg total) by mouth 2 (two) times daily as needed. 60 tablet 2  . omeprazole (PRILOSEC) 20 MG capsule Take 1 capsule (20 mg total) by mouth daily. 30 capsule 4  . polyethylene glycol powder (GLYCOLAX/MIRALAX) 17 GM/SCOOP powder Take 17 g by mouth daily as needed. 3350 g 1  . [DISCONTINUED] sucralfate (CARAFATE) 1 g tablet Take 1 tablet (1 g total) by mouth 4 (four) times daily -  with meals and at bedtime. 120 tablet 0   No current facility-administered medications on file prior to visit.    No Known Allergies  Social History   Socioeconomic History  . Marital status: Single    Spouse name: Not on file  . Number of children: 3  . Years of education: Not on file  . Highest education level: Not on file  Occupational History  . Not on file  Tobacco Use  .  Smoking status: Current Every Day Smoker    Packs/day: 1.00    Types: Cigarettes  . Smokeless tobacco: Never Used  Vaping Use  . Vaping Use: Never used  Substance and Sexual Activity  . Alcohol use: Not Currently    Comment: occasionally  . Drug use: Not Currently    Frequency: 7.0 times per week    Types: Marijuana    Comment: daily  . Sexual activity: Not on file  Other Topics Concern  . Not on file  Social History Narrative  . Not on file   Social Determinants of Health   Financial Resource Strain:   . Difficulty of Paying Living Expenses: Not on file  Food Insecurity:   . Worried About Programme researcher, broadcasting/film/video in the Last Year: Not on file  . Ran Out  of Food in the Last Year: Not on file  Transportation Needs:   . Lack of Transportation (Medical): Not on file  . Lack of Transportation (Non-Medical): Not on file  Physical Activity:   . Days of Exercise per Week: Not on file  . Minutes of Exercise per Session: Not on file  Stress:   . Feeling of Stress : Not on file  Social Connections:   . Frequency of Communication with Friends and Family: Not on file  . Frequency of Social Gatherings with Friends and Family: Not on file  . Attends Religious Services: Not on file  . Active Member of Clubs or Organizations: Not on file  . Attends Banker Meetings: Not on file  . Marital Status: Not on file  Intimate Partner Violence:   . Fear of Current or Ex-Partner: Not on file  . Emotionally Abused: Not on file  . Physically Abused: Not on file  . Sexually Abused: Not on file    Family History  Problem Relation Age of Onset  . Diabetes Mother   . Hypertension Mother   . Prostate cancer Paternal Grandfather     Past Surgical History:  Procedure Laterality Date  . arm surgery    . EXTENSOR TENDON OF FOREARM / WRIST REPAIR  2018    ROS: Review of Systems Negative except as stated above  PHYSICAL EXAM: BP 102/63   Pulse 67   Resp 16   Wt 158 lb 6.4 oz (71.8 kg)   SpO2 97%   BMI 23.39 kg/m   Wt Readings from Last 3 Encounters:  11/04/19 158 lb 6.4 oz (71.8 kg)  11/03/19 165 lb (74.8 kg)  09/07/19 157 lb (71.2 kg)    Physical Exam  General appearance -young to middle-aged African-American male who appears euphoric. Mental status -patient oriented. Chest - clear to auscultation, no wheezes, rales or rhonchi, symmetric air entry Heart - normal rate, regular rhythm, normal S1, S2, no murmurs, rubs, clicks or gallops Musculoskeletal -left wrist: Mild edema.  Discomfort on palpation of the medial and lateral aspect of the wrist.  No erythema or increased warmth.  Discomfort with passive range of motion of the wrist  in all directions.  Discomfort with passive range of motion of the thumb.  CMP Latest Ref Rng & Units 09/07/2019 09/25/2009  Glucose 65 - 99 mg/dL 92 662(H)  BUN 6 - 24 mg/dL 10 20  Creatinine 4.76 - 1.27 mg/dL 5.46 1.1  Sodium 503 - 144 mmol/L 141 142  Potassium 3.5 - 5.2 mmol/L 4.2 3.9  Chloride 96 - 106 mmol/L 105 104  CO2 20 - 29 mmol/L 27 30  Calcium 8.7 -  10.2 mg/dL 8.7 9.7  Total Protein 6.0 - 8.5 g/dL 5.3(L) 8.0  Total Bilirubin 0.0 - 1.2 mg/dL 0.2 1.2  Alkaline Phos 48 - 121 IU/L 61 84  AST 0 - 40 IU/L 14 32  ALT 0 - 44 IU/L 13 37   Lipid Panel     Component Value Date/Time   CHOL 158 09/07/2019 1511   TRIG 153 (H) 09/07/2019 1511   HDL 34 (L) 09/07/2019 1511   CHOLHDL 4.6 09/07/2019 1511   LDLCALC 97 09/07/2019 1511    CBC    Component Value Date/Time   WBC 6.7 09/07/2019 1511   WBC 6.5 07/02/2019 1318   RBC 4.53 09/07/2019 1511   RBC 4.72 07/02/2019 1318   HGB 13.6 09/07/2019 1511   HCT 39.9 09/07/2019 1511   PLT 208 09/07/2019 1511   MCV 88 09/07/2019 1511   MCH 30.0 09/07/2019 1511   MCH 29.2 07/02/2019 1318   MCHC 34.1 09/07/2019 1511   MCHC 33.7 07/02/2019 1318   RDW 13.0 09/07/2019 1511   LYMPHSABS 2.2 09/25/2009 0715   MONOABS 0.8 09/25/2009 0715   EOSABS 0.1 09/25/2009 0715   BASOSABS 0.0 09/25/2009 0715    ASSESSMENT AND PLAN: 1. Wrist pain, acute, left Likely tendinitis.  I will send prescription for Naprosyn to the pharmacy.  Recommend warm compresses.  He will continue using the wrist splint.  Given a note to be off work for 1 week. - naproxen (NAPROSYN) 500 MG tablet; Take 1 tablet twice daily for wrist pain.  Dispense: 20 tablet; Refill: 0  2. Prediabetes Discussed the importance of healthy eating habits and regular exercise.  Dietary counseling given.  Advised to eliminate sugary drinks from the diet, try to eat more white lean meat instead of red meat, smaller portion sizes of white carbohydrates and incorporate fresh fruits and  vegetables into the diet.  3. Injury of left rotator cuff, initial encounter We will refer to orthopedics once he is approved for the orange card/cone discount. - cyclobenzaprine (FLEXERIL) 5 MG tablet; Take 1 tablet (5 mg total) by mouth 2 (two) times daily as needed for muscle spasms.  Dispense: 30 tablet; Refill: 1 - naproxen (NAPROSYN) 500 MG tablet; Take 1 tablet twice daily for wrist pain.  Dispense: 20 tablet; Refill: 0  4. Abnormal TSH Recheck TSH - TSH  5. Blood per rectum 6. Unexplained weight loss He has had no further weight loss since last visit with me.  However he is still having blood in the stools and when he wipes.  Once he is approved for the orange card, he will let me know so that we could submit a referral to GI.  7. Influenza vaccination declined This was offered.  Patient declined.  8. Tetanus, diphtheria, and acellular pertussis (Tdap) vaccination declined This was offered.  Patient declined.    Patient was given the opportunity to ask questions.  Patient verbalized understanding of the plan and was able to repeat key elements of the plan.   Orders Placed This Encounter  Procedures  . TSH     Requested Prescriptions   Signed Prescriptions Disp Refills  . cyclobenzaprine (FLEXERIL) 5 MG tablet 30 tablet 1    Sig: Take 1 tablet (5 mg total) by mouth 2 (two) times daily as needed for muscle spasms.  . naproxen (NAPROSYN) 500 MG tablet 20 tablet 0    Sig: Take 1 tablet twice daily for wrist pain.    Return if symptoms worsen or fail to  improve.  Karle Plumber, MD, FACP

## 2019-11-05 LAB — TSH: TSH: 0.777 u[IU]/mL (ref 0.450–4.500)

## 2019-11-10 ENCOUNTER — Ambulatory Visit: Payer: Self-pay

## 2020-07-25 ENCOUNTER — Other Ambulatory Visit: Payer: Self-pay
